# Patient Record
Sex: Male | Born: 2000 | Race: White | Hispanic: No | Marital: Single | Smoking: Current some day smoker
Health system: Southern US, Community
[De-identification: ages and names within clinical notes are randomized; demographics above are authoritative.]

## PROBLEM LIST (undated history)

## (undated) DIAGNOSIS — N433 Hydrocele, unspecified: Secondary | ICD-10-CM

## (undated) HISTORY — DX: Hydrocele, unspecified: N43.3

---
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
## 2019-06-18 ENCOUNTER — Encounter (HOSPITAL_COMMUNITY): Payer: Self-pay | Admitting: Emergency Medicine

## 2019-06-18 ENCOUNTER — Emergency Department (HOSPITAL_COMMUNITY)
Admission: EM | Admit: 2019-06-18 | Discharge: 2019-06-18 | Disposition: A | Discharge status: Home / Self Care | Payer: Medicaid Other | Attending: Emergency Medicine | Admitting: Emergency Medicine

## 2019-06-18 ENCOUNTER — Other Ambulatory Visit: Payer: Self-pay

## 2019-06-18 DIAGNOSIS — F410 Panic disorder [episodic paroxysmal anxiety] without agoraphobia: Secondary | ICD-10-CM | POA: Diagnosis not present

## 2019-06-18 DIAGNOSIS — Z5321 Procedure and treatment not carried out due to patient leaving prior to being seen by health care provider: Secondary | ICD-10-CM | POA: Diagnosis not present

## 2019-06-18 NOTE — ED Triage Notes (Signed)
Pt. Stated, Ray Jimenez been having panic attacks for the last 2 months and the last 2 weeks they have been worse. My hands will lock up.

## 2019-06-18 NOTE — ED Notes (Signed)
Patient called for vitals recheck x2 with no answer

## 2019-06-18 NOTE — ED Notes (Signed)
No answer for vitals recheck x1 

## 2019-06-18 NOTE — ED Notes (Signed)
No answer for vitals recheck x3. Don't see pt in waiting room

## 2019-07-22 ENCOUNTER — Ambulatory Visit: Payer: Medicaid Other | Admitting: Neurology

## 2019-08-03 ENCOUNTER — Ambulatory Visit: Payer: Medicaid Other | Admitting: Diagnostic Neuroimaging

## 2019-08-31 ENCOUNTER — Ambulatory Visit: Payer: Medicaid Other | Admitting: Neurology

## 2020-02-07 ENCOUNTER — Emergency Department
Admission: EM | Admit: 2020-02-07 | Discharge: 2020-02-07 | Disposition: A | Discharge status: Home / Self Care | Payer: Medicaid Other | Attending: Emergency Medicine | Admitting: Emergency Medicine

## 2020-02-07 ENCOUNTER — Emergency Department: Payer: Medicaid Other

## 2020-02-07 ENCOUNTER — Encounter: Payer: Self-pay | Admitting: Emergency Medicine

## 2020-02-07 ENCOUNTER — Other Ambulatory Visit: Payer: Self-pay

## 2020-02-07 DIAGNOSIS — Y929 Unspecified place or not applicable: Secondary | ICD-10-CM | POA: Insufficient documentation

## 2020-02-07 DIAGNOSIS — Y9367 Activity, basketball: Secondary | ICD-10-CM | POA: Insufficient documentation

## 2020-02-07 DIAGNOSIS — S83095A Other dislocation of left patella, initial encounter: Secondary | ICD-10-CM | POA: Insufficient documentation

## 2020-02-07 DIAGNOSIS — S8992XA Unspecified injury of left lower leg, initial encounter: Secondary | ICD-10-CM | POA: Diagnosis present

## 2020-02-07 DIAGNOSIS — F17210 Nicotine dependence, cigarettes, uncomplicated: Secondary | ICD-10-CM | POA: Insufficient documentation

## 2020-02-07 DIAGNOSIS — X58XXXA Exposure to other specified factors, initial encounter: Secondary | ICD-10-CM | POA: Diagnosis not present

## 2020-02-07 DIAGNOSIS — Y999 Unspecified external cause status: Secondary | ICD-10-CM | POA: Diagnosis not present

## 2020-02-07 DIAGNOSIS — S83005A Unspecified dislocation of left patella, initial encounter: Secondary | ICD-10-CM

## 2020-02-07 MED ORDER — FENTANYL CITRATE (PF) 100 MCG/2ML IJ SOLN
INTRAMUSCULAR | Status: AC
  Administered 2020-02-07: 50 ug via INTRAVENOUS
  Filled 2020-02-07: qty 2

## 2020-02-07 MED ORDER — KETOROLAC TROMETHAMINE 30 MG/ML IJ SOLN
INTRAMUSCULAR | Status: AC
  Administered 2020-02-07: 30 mg via INTRAVENOUS
  Filled 2020-02-07: qty 1

## 2020-02-07 MED ORDER — KETOROLAC TROMETHAMINE 30 MG/ML IJ SOLN: 30.0000 mg | Freq: Once | INTRAMUSCULAR | Status: AC

## 2020-02-07 MED ORDER — FENTANYL CITRATE (PF) 100 MCG/2ML IJ SOLN
50.0000 ug | Freq: Once | INTRAMUSCULAR | Status: AC
  Filled 2020-02-07: qty 2

## 2020-02-07 MED ORDER — FENTANYL CITRATE (PF) 100 MCG/2ML IJ SOLN
50.0000 ug | Freq: Once | INTRAMUSCULAR | Status: AC
  Administered 2020-02-07: 50 ug via INTRAVENOUS

## 2020-02-07 NOTE — ED Triage Notes (Signed)
Pt presents to ED via [facility] from basketball court c/o L knee injury. Obvious deformity noted to L knee. Pt given Fentanyl by EMS PTA.

## 2020-02-07 NOTE — ED Provider Notes (Signed)
[facility] Emergency Department Provider Note   ____________________________________________   First MD Initiated Contact with Patient 02/07/20 1649     (approximate)  I have reviewed the triage vital signs and the nursing notes.   HISTORY  Chief Complaint Knee Injury    HPI Ray Jimenez is a 19 y.o. male   here for evaluation of a left knee injury  Patient was running, playing basketball when all of a sudden he felt his left knee locked up on him.  He fell to the ground catching himself.  He didn't get injured falling or strike his head, but he reports his knee was locked up  Called EMS because of severe pain and deformity of his left knee  No numbness or weakness in the left foot or toes.  Denies any injury, reports he was running and it suddenly popped in the left knee  Pain 10 out of 10 sharp severe left knee worsened by any movement  Past Medical History:  Diagnosis Date  . Hydrocele     There are no problems to display for this patient.   History reviewed. No pertinent surgical history.  Prior to Admission medications   Medication Sig Start Date End Date Taking? Authorizing Provider  ibuprofen (ADVIL,MOTRIN) 600 MG tablet Take 1 tablet (600 mg total) by mouth every 6 (six) hours as needed. 01/23/18   Sherrilee Gilles, NP    Allergies Patient has no known allergies.  Family History  Problem Relation Age of Onset  . Allergies Mother   . Ovarian cancer Maternal Grandmother   . Diabetes Paternal Grandmother   . Hypertension Paternal Grandmother     Social History Social History   Tobacco Use  . Smoking status: Current Every Day Smoker  . Smokeless tobacco: Never Used  Substance Use Topics  . Alcohol use: Not Currently  . Drug use: Yes    Types: Marijuana    Review of Systems Constitutional: No fever/chills or recent illness Eyes: No visual changes. ENT: No sore throat. Cardiovascular: Denies chest  pain. Respiratory: Denies shortness of breath. Gastrointestinal: No abdominal pain.   Genitourinary: Negative for dysuria. Musculoskeletal: Negative for back pain.  See HPI Skin: Negative for rash. Neurological: Negative for headaches or areas of focal weakness or numbness    ____________________________________________   PHYSICAL EXAM:  VITAL SIGNS: ED Triage Vitals  Enc Vitals Group     BP 02/07/20 1653 (!) 159/99     Pulse Rate 02/07/20 1653 64     Resp 02/07/20 1653 18     Temp 02/07/20 1653 98.6 F (37 C)     Temp src --      SpO2 02/07/20 1653 99 %     Weight 02/07/20 1647 135 lb (61.2 kg)     Height 02/07/20 1647 5\' 5"  (1.651 m)     Head Circumference --      Peak Flow --      Pain Score 02/07/20 1654 9     Pain Loc --      Pain Edu? --      Excl. in GC? --     Constitutional: Alert and oriented. Well appearing  Eyes: Conjunctivae are normal. Head: Atraumatic. Nose: No congestion/rhinnorhea. Mouth/Throat: Mucous membranes are moist. Neck: No stridor.  Cardiovascular: Normal rate, regular rhythm. Grossly normal heart sounds.  Good peripheral circulation. Respiratory: Normal respiratory effort.  No retractions. Lungs CTAB. Gastrointestinal: Soft and nontender. No distention. Musculoskeletal:   Lower Extremities  No  edema. Normal DP/PT pulses bilateral with good cap refill.  Normal neuro-motor function lower extremities bilateral.  RIGHT Right lower extremity demonstrates normal strength, good use of all muscles. No edema bruising or contusions of the right hip, right knee, right ankle. Full range of motion of the right lower extremity without pain. No pain on axial loading. No evidence of trauma.  LEFT Left lower extremity demonstrates normal strength, good use of all muscles except as limited left knee. No edema bruising or contusions of the hip,  ankle. Full range of motion of the left lower extremity except across the knee where he has significant pain  as well as deformity including patellar dislocation lateral.     Neurologic:  Normal speech and language. No gross focal neurologic deficits are appreciated.  Skin:  Skin is warm, dry and intact. No rash noted. Psychiatric: Mood and affect are normal. Speech and behavior are normal.  ____________________________________________   LABS (all labs ordered are listed, but only abnormal results are displayed)  Labs Reviewed - No data to display ____________________________________________  EKG   ____________________________________________  RADIOLOGY  DG Knee AP/LAT W/Sunrise Left  Result Date: 02/07/2020 CLINICAL DATA:  Knee pain.  Patellar dislocation, postreduction EXAM: LEFT KNEE 3 VIEWS COMPARISON:  None. FINDINGS: Small to moderate joint effusion. No acute bony abnormality. Specifically, no fracture, subluxation, or dislocation. Soft tissues intact. IMPRESSION: Small to moderate joint effusion.  No acute bony abnormality. Electronically Signed   By: Rolm Baptise M.D.   On: 02/07/2020 17:51    Postreduction left knee x-ray with joint effusion but no acute bony abnormality. ____________________________________________   PROCEDURES  Procedure(s) performed: Patella reduction  Reduction of dislocation  Date/Time: 02/07/2020 6:37 PM Performed by: Delman Kitten, MD Authorized by: Delman Kitten, MD  Consent: Verbal consent obtained. Written consent not obtained. Risks and benefits: risks, benefits and alternatives were discussed Consent given by: patient Patient understanding: patient states understanding of the procedure being performed Imaging studies: imaging studies available Required items: required blood products, implants, devices, and special equipment available Patient identity confirmed: verbally with patient and arm band Time out: Immediately prior to procedure a "time out" was called to verify the correct patient, procedure, equipment, support staff and site/side marked  as required. Local anesthesia used: no  Anesthesia: Local anesthesia used: no  Sedation: Patient sedated: no  Comments: Patient left patella reduction performed utilizing standard technique.  Patient tolerated procedure well without any difficulties.     Critical Care performed: No  ____________________________________________   INITIAL IMPRESSION / ASSESSMENT AND PLAN / ED COURSE  Pertinent labs & imaging results that were available during my care of the patient were reviewed by me and considered in my medical decision making (see chart for details).   Patient here for evaluation of left knee pain, my clinical examination appears to have an obvious left lateral patellar dislocation.  This was reduced without complication in the ER.  Patient tolerated the procedure very well.  Postreduction films show good alignment with small effusion.    ----------------------------------------- 6:56 PM on 02/07/2020 -----------------------------------------  Patient resting comfortably reports he feels much better.  Reviewed care recommendations recommendation to follow-up with orthopedics in 1 week.  Patient and his mother both present, comfortable with the plan.  No ongoing issues involving the left leg or left foot on the just feels sore over the knee but feels much better overall  Normal distal capillary refill and neurologic exam left lower foot   ____________________________________________   FINAL CLINICAL  IMPRESSION(S) / ED DIAGNOSES  Final diagnoses:  Closed dislocation of left patella, initial encounter        Note:  This document was prepared using Dragon voice recognition software and may include unintentional dictation errors       Sharyn Creamer, MD 02/07/20 1856

## 2020-02-15 ENCOUNTER — Other Ambulatory Visit: Payer: Self-pay | Admitting: Student

## 2020-02-15 DIAGNOSIS — S83005A Unspecified dislocation of left patella, initial encounter: Secondary | ICD-10-CM

## 2020-02-19 ENCOUNTER — Ambulatory Visit
Admission: RE | Admit: 2020-02-19 | Discharge: 2020-02-19 | Disposition: A | Discharge status: Home / Self Care | Payer: Medicaid Other | Source: Ambulatory Visit | Attending: Student | Admitting: Student

## 2020-02-19 DIAGNOSIS — S83005A Unspecified dislocation of left patella, initial encounter: Secondary | ICD-10-CM | POA: Insufficient documentation

## 2020-10-10 ENCOUNTER — Encounter (INDEPENDENT_AMBULATORY_CARE_PROVIDER_SITE_OTHER): Payer: Self-pay | Admitting: Student in an Organized Health Care Education/Training Program

## 2021-08-16 ENCOUNTER — Other Ambulatory Visit: Payer: Self-pay | Admitting: Student

## 2021-08-16 DIAGNOSIS — M25862 Other specified joint disorders, left knee: Secondary | ICD-10-CM

## 2021-08-16 DIAGNOSIS — S83005D Unspecified dislocation of left patella, subsequent encounter: Secondary | ICD-10-CM

## 2021-08-24 ENCOUNTER — Ambulatory Visit
Admission: RE | Admit: 2021-08-24 | Discharge: 2021-08-24 | Disposition: A | Discharge status: Home / Self Care | Payer: Medicaid Other | Source: Ambulatory Visit | Attending: Student | Admitting: Student

## 2021-08-24 ENCOUNTER — Other Ambulatory Visit: Payer: Self-pay

## 2021-08-24 DIAGNOSIS — M25862 Other specified joint disorders, left knee: Secondary | ICD-10-CM | POA: Diagnosis present

## 2021-08-24 DIAGNOSIS — S83005D Unspecified dislocation of left patella, subsequent encounter: Secondary | ICD-10-CM | POA: Insufficient documentation

## 2023-05-15 IMAGING — MR MR KNEE*L* W/O CM
7 series · 40 of 40 positions shown · non-contrast
Comparison: X-ray 02/07/2020, MRI 02/19/2020

CLINICAL DATA: Anterior left knee pain after fall playing
basketball 2 years ago with prior transient lateral patellar
dislocation

EXAM:
MRI OF THE LEFT KNEE WITHOUT CONTRAST
TECHNIQUE: Multiplanar, multisequence MR imaging of the knee was performed. No
intravenous contrast was administered.

[Series 8: T2 fat-sat · axial · left · 4.0mm · 0.50mm/px · z∈[-100,+24]mm · 7 of 26 slices shown (1 of 3)]
[im 1/26]
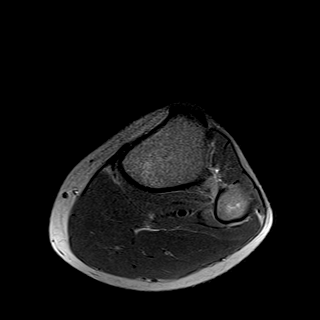
[im 5/26]
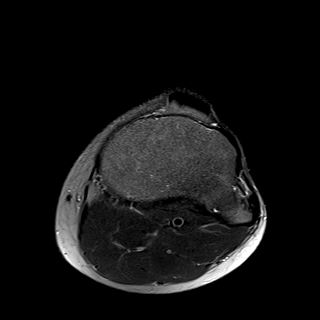
[im 9/26]
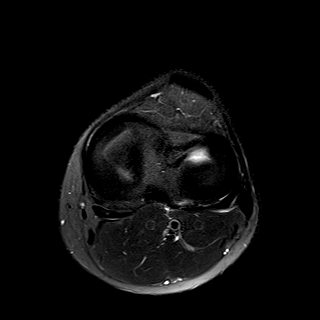
[im 13/26]
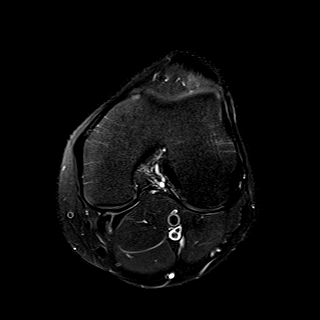
[im 17/26]
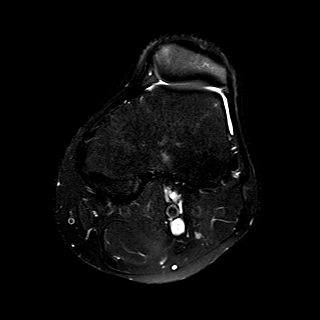
[im 21/26]
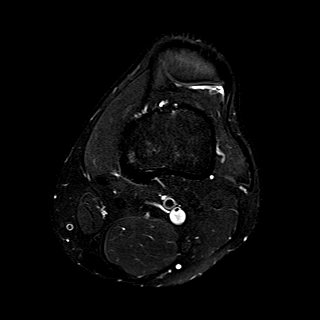
[im 26/26]
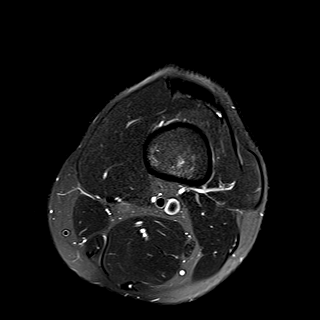

[Series 9: T2 fat-sat · coronal · left · 4.0mm · 0.59mm/px · 6 of 28 slices shown (2 of 3)]
[im 1/28]
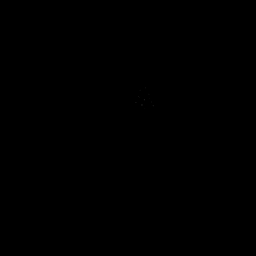
[im 6/28]
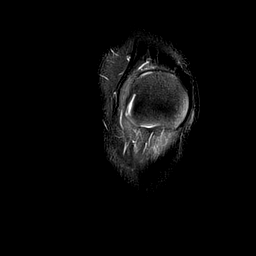
[im 11/28]
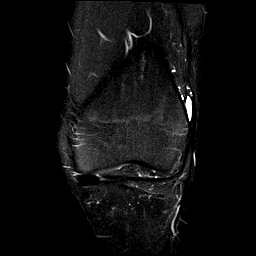
[im 17/28]
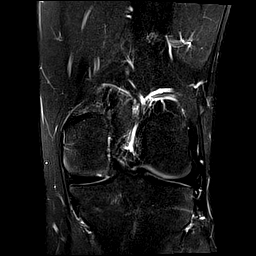
[im 22/28]
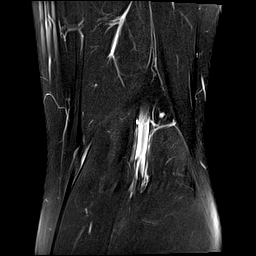
[im 28/28]
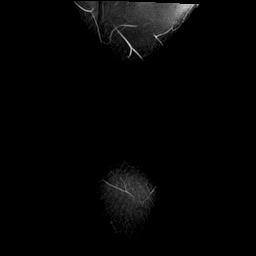

[Series 10: T1 · coronal · left · 4.0mm · 0.59mm/px · 6 of 28 slices shown]
[im 1/28]
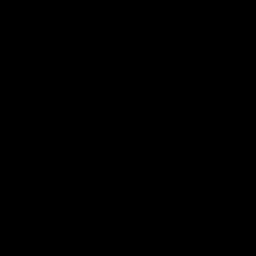
[im 6/28]
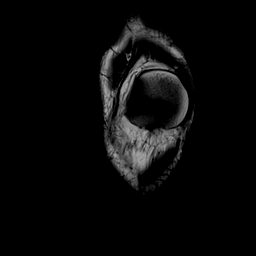
[im 11/28]
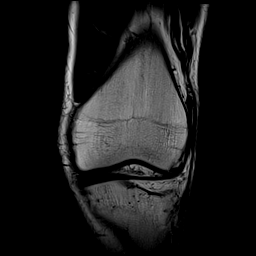
[im 17/28]
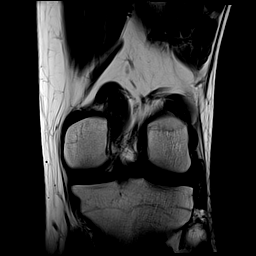
[im 22/28]
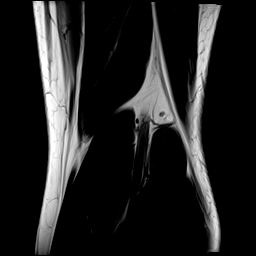
[im 28/28]
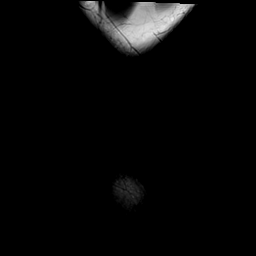

[Series 11: PD fat-sat · coronal · left · 4.0mm · 0.59mm/px · 6 of 28 slices shown (1 of 2)]
[im 1/28]
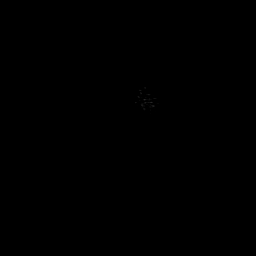
[im 6/28]
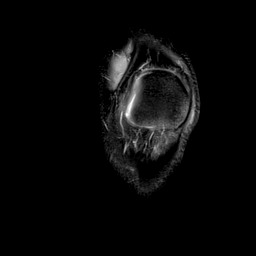
[im 11/28]
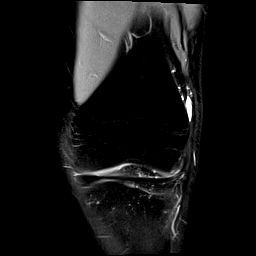
[im 17/28]
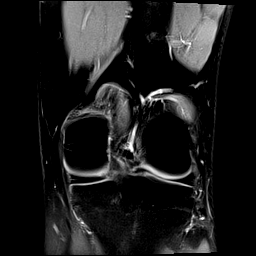
[im 22/28]
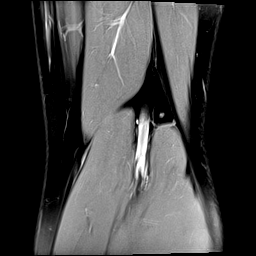
[im 28/28]
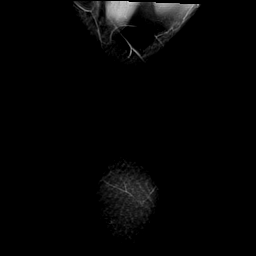

[Series 12: PD fat-sat · sagittal · left · 3.0mm · 0.59mm/px · 6 of 30 slices shown (2 of 2)]
[im 1/30]
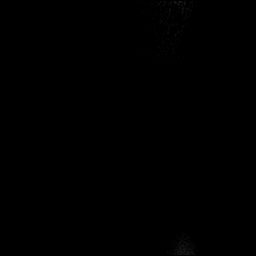
[im 6/30]
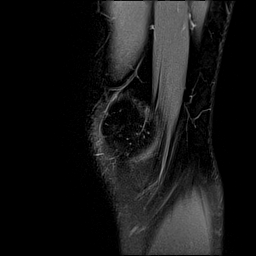
[im 12/30]
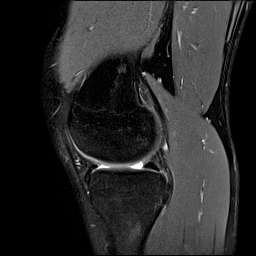
[im 18/30]
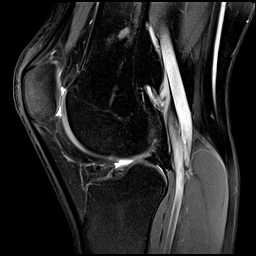
[im 24/30]
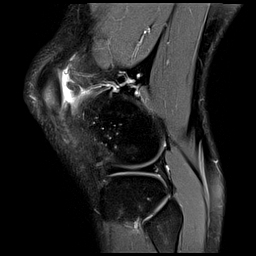
[im 30/30]
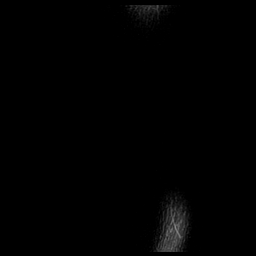

[Series 13: T2 fat-sat · sagittal · left · 3.0mm · 0.59mm/px · 6 of 30 slices shown (3 of 3)]
[im 1/30]
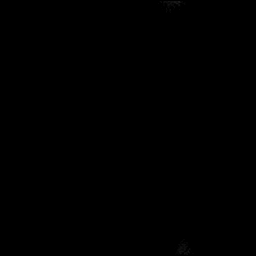
[im 6/30]
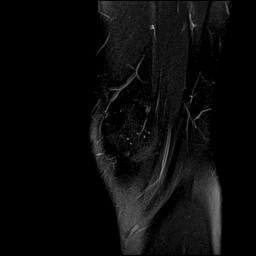
[im 12/30]
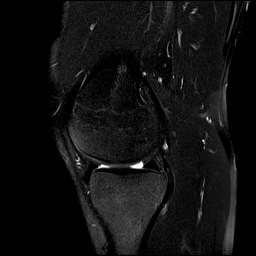
[im 18/30]
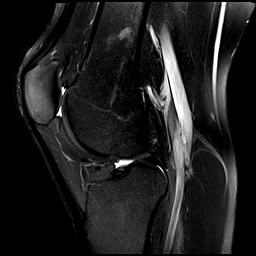
[im 24/30]
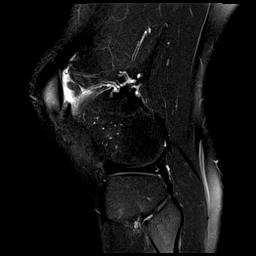
[im 30/30]
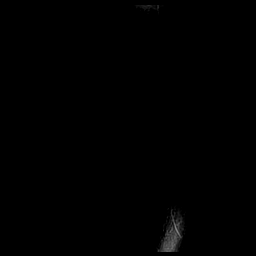

[Series 14: PD · coronal · left · 2.0mm · 0.47mm/px · 3 of 16 slices shown]
[im 1/16]
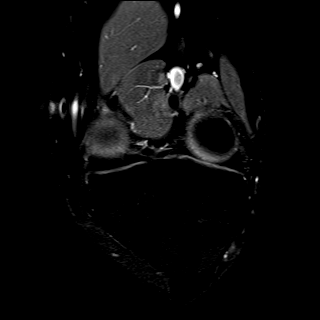
[im 8/16]
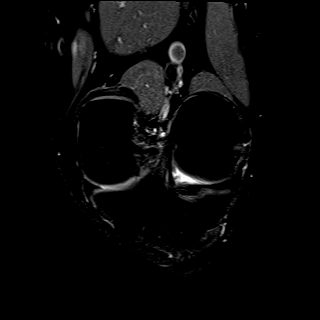
[im 16/16]
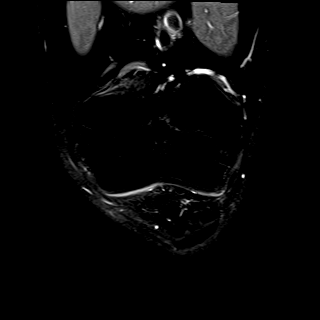

[40 of 40 positions shown; findings below may reference images not displayed]

FINDINGS: MENISCI

Medial meniscus:  Intact.

Lateral meniscus:  Intact.

LIGAMENTS

Cruciates:  Intact ACL and PCL.

Collaterals: Medial collateral ligament is intact. Lateral
collateral ligament complex is intact.

CARTILAGE

Patellofemoral:  Normal.

Medial:  Normal.

Lateral:  Normal.

Joint: Physiologic amount of joint fluid without significant
effusion. Normal fat pads.

Popliteal Fossa:  No Baker cyst. Intact popliteus tendon.

Extensor Mechanism: Intact quadriceps tendon and patellar tendon.
MPFL and patellar retinacula are intact.

Bones: No focal marrow signal abnormality. No fracture or
dislocation. TT-TG distance of 5 mm. Patella alta alignment.

Other: No soft tissue edema or fluid collection. Normal muscle bulk
and signal intensity.
IMPRESSION: Patella alta alignment. Otherwise unremarkable MRI of the left knee.

## 2024-04-06 ENCOUNTER — Emergency Department (HOSPITAL_COMMUNITY)

## 2024-04-06 ENCOUNTER — Emergency Department (HOSPITAL_COMMUNITY)
Admission: EM | Admit: 2024-04-06 | Discharge: 2024-04-06 | Disposition: A | Discharge status: Home / Self Care | Attending: Emergency Medicine | Admitting: Emergency Medicine

## 2024-04-06 ENCOUNTER — Encounter (HOSPITAL_COMMUNITY): Payer: Self-pay

## 2024-04-06 ENCOUNTER — Other Ambulatory Visit: Payer: Self-pay

## 2024-04-06 DIAGNOSIS — Y9367 Activity, basketball: Secondary | ICD-10-CM | POA: Insufficient documentation

## 2024-04-06 DIAGNOSIS — X509XXA Other and unspecified overexertion or strenuous movements or postures, initial encounter: Secondary | ICD-10-CM | POA: Insufficient documentation

## 2024-04-06 DIAGNOSIS — S83005A Unspecified dislocation of left patella, initial encounter: Secondary | ICD-10-CM | POA: Diagnosis not present

## 2024-04-06 DIAGNOSIS — M25562 Pain in left knee: Secondary | ICD-10-CM | POA: Diagnosis present

## 2024-04-06 MED ORDER — DIAZEPAM 5 MG/ML IJ SOLN
2.5000 mg | Freq: Once | INTRAMUSCULAR | Status: AC
  Administered 2024-04-06: 2.5 mg via INTRAVENOUS
  Filled 2024-04-06: qty 2

## 2024-04-06 NOTE — ED Provider Notes (Signed)
 Ray Jimenez EMERGENCY DEPARTMENT AT [facility] Provider Note   CSN: [id number] Arrival date & time: 04/06/24  1046     History  Chief Complaint  Patient presents with   Knee Injury    Ray Jimenez is a 23 y.o. male.  Patient here with left knee pain.  Prior patellar dislocation in the past.  Similar injury today while playing basketball.  Got 100 mcg of fentanyl  with EMS.  He states that he did this to this knee about 4 years ago.  Patient denies any weakness numbness tingling.  Denies any pain elsewhere.  He made a hard cut while playing basketball pain to his left knee noticed deformity.  The history is provided by the patient.       Home Medications Prior to Admission medications   Medication Sig Start Date End Date Taking? Authorizing Provider  ibuprofen  (ADVIL ,MOTRIN ) 600 MG tablet Take 1 tablet (600 mg total) by mouth every 6 (six) hours as needed. 01/23/18   Jannine Meo, NP      Allergies    Patient has no known allergies.    Review of Systems   Review of Systems  Physical Exam Updated Vital Signs BP (!) 136/92   Pulse 87   Temp 98.3 F (36.8 C) (Oral)   Resp 17   Ht 5\' 5"  (1.651 m)   Wt 72.6 kg   SpO2 97%   BMI 26.63 kg/m  Physical Exam Vitals and nursing note reviewed.  Constitutional:      General: He is not in acute distress.    Appearance: He is well-developed.  HENT:     Head: Normocephalic and atraumatic.     Nose: Nose normal.     Mouth/Throat:     Mouth: Mucous membranes are moist.  Eyes:     Extraocular Movements: Extraocular movements intact.     Conjunctiva/sclera: Conjunctivae normal.     Pupils: Pupils are equal, round, and reactive to light.  Cardiovascular:     Rate and Rhythm: Normal rate and regular rhythm.     Pulses: Normal pulses.     Heart sounds: Normal heart sounds. No murmur heard. Pulmonary:     Effort: Pulmonary effort is normal. No respiratory distress.     Breath sounds: Normal breath sounds.   Abdominal:     Palpations: Abdomen is soft.     Tenderness: There is no abdominal tenderness.  Musculoskeletal:        General: Tenderness present. No swelling.     Cervical back: Neck supple.     Comments: Tenderness to the left knee with lateral translation of the patella to the knee  Skin:    General: Skin is warm and dry.     Capillary Refill: Capillary refill takes less than 2 seconds.  Neurological:     Mental Status: He is alert.  Psychiatric:        Mood and Affect: Mood normal.     ED Results / Procedures / Treatments   Labs (all labs ordered are listed, but only abnormal results are displayed) Labs Reviewed - No data to display  EKG None  Radiology DG Knee Complete 4 Views Left Result Date: 04/06/2024 CLINICAL DATA:  Status post reduction of patellar dislocation. EXAM: LEFT KNEE - COMPLETE 4+ VIEW COMPARISON:  Left knee radiographs dated 02/07/2020. MRI of the left knee dated 08/24/2021. FINDINGS: No evidence of acute fracture or dislocation. Moderate-to-large knee joint effusion. No evidence of arthropathy or other focal bone  abnormality. IMPRESSION: 1. No radiographically evident acute fracture or dislocation. 2. Moderate-to-large knee joint effusion. Electronically Signed   By: Mannie Seek M.D.   On: 04/06/2024 13:45    Procedures .Reduction of dislocation  Date/Time: 04/06/2024 11:21 AM  Performed by: Lowery Rue, DO Authorized by: Lowery Rue, DO  Consent: Verbal consent obtained. Written consent obtained. Risks and benefits: risks, benefits and alternatives were discussed Consent given by: patient Patient understanding: patient states understanding of the procedure being performed Patient consent: the patient's understanding of the procedure matches consent given Required items: required blood products, implants, devices, and special equipment available Patient identity confirmed: verbally with patient Time out: Immediately prior to procedure a  "time out" was called to verify the correct patient, procedure, equipment, support staff and site/side marked as required. Preparation: Patient was prepped and draped in the usual sterile fashion. Local anesthesia used: no  Anesthesia: Local anesthesia used: no  Sedation: Patient sedated: no  Patient tolerance: patient tolerated the procedure well with no immediate complications Comments: Patient had successful reduction of left patellar dislocation.  Patient was given 2.5 mg of Valium IV for comfort prior to this.  Tolerated well.  Postreduction film shows alignment of patellar with no fracture or malalignment otherwise.       Medications Ordered in ED Medications  diazepam (VALIUM) injection 2.5 mg (2.5 mg Intravenous Given 04/06/24 1058)    ED Course/ Medical Decision Making/ A&P                                 Medical Decision Making Amount and/or Complexity of Data Reviewed Radiology: ordered.  Risk Prescription drug management.   Ray Jimenez is here with left knee injury.  Unremarkable vitals.  No fever.  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  On exam it looks like he is dislocated his left patellar.  He is neurovascular neuromuscular intact on exam otherwise.  Patient is given 2.5 mg IV Valium for anxiolysis and I perform reduction of his left patellar fracture without any issues.  Got an x-ray of the left knee afterwards and showed no dislocation malalignment or fracture.  Did show moderate to large knee joint effusion.  Neurovascularly intact before and after reduction.  Will place in a knee immobilizer.  Will have him follow-up with Dr. Christiane Cowing with orthopedics.  Minimal weightbearing with crutches and knee immobilizer until he can follow-up with orthopedics.  Recommend Tylenol ibuprofen  ice and rest.  Understands return precautions.  Discharge.  This chart was dictated using voice recognition software.  Despite best efforts to proofread,  errors can  occur which can change the documentation meaning.         Final Clinical Impression(s) / ED Diagnoses Final diagnoses:  Patellar dislocation, left, initial encounter    Rx / DC Orders ED Discharge Orders     None         Lowery Rue, DO 04/06/24 1426

## 2024-04-06 NOTE — Progress Notes (Signed)
 Orthopedic Tech Progress Note Patient Details:  IVA POSTEN [birth year] [id number]  Ortho Devices Type of Ortho Device: Knee Immobilizer Ortho Device/Splint Location: LLE Ortho Device/Splint Interventions: Ordered, Application, Adjustment   Post Interventions Patient Tolerated: Well Instructions Provided: Care of device  Kermitt Pedlar 04/06/2024, 1:01 PM

## 2024-04-06 NOTE — ED Notes (Signed)
 Patient transported to X-ray

## 2024-04-06 NOTE — Discharge Instructions (Signed)
 Wear knee immobilizer at all time.  It is okay to remove for shower.  But try to avoid putting any major weight or major flexion extension of the left knee until you see orthopedics.  Use crutches and recommend very minimal weightbearing to left lower extremity if needed at all.  Recommend Tylenol ibuprofen  for pain.  1000 mg of Tylenol every 6 hours as needed for pain.  800 mg ibuprofen  every 8 hours as needed for pain.  Recommend ice.  Follow-up with orthopedics Dr. Christiane Cowing

## 2024-04-06 NOTE — ED Triage Notes (Signed)
 Pt to er trauma A, per ems pt was playing basketball and landed on his L knee, pt had deformity to knee, pt has what appears to be a dislocated patella, pt states that this has happened to him in the past with the same knee. EMS placed a 22 gauge in L hand and gave 100mcg of fentanyl .

## 2024-04-13 ENCOUNTER — Encounter: Payer: Self-pay | Admitting: Physician Assistant

## 2024-04-13 ENCOUNTER — Ambulatory Visit: Admitting: Physician Assistant

## 2024-04-13 DIAGNOSIS — S83005A Unspecified dislocation of left patella, initial encounter: Secondary | ICD-10-CM

## 2024-04-13 NOTE — Addendum Note (Signed)
 Addended by: Tatiyanna Lashley on: 04/13/2024 11:08 AM   Modules accepted: Orders

## 2024-04-13 NOTE — Progress Notes (Signed)
 Office Visit Note   Patient: Ray Jimenez           Date of Birth: May 09, 2001           MRN: [id number] Visit Date: 04/13/2024              Requested by: Josefine Nice, MD [address] [place],  [place] [address] PCP: Josefine Nice, MD   Assessment & Plan: Visit Diagnoses:  1. Patellar dislocation, left, initial encounter     Plan: Impression is left knee patellar dislocation.  This is happened twice now and patient currently has a moderate effusion and tenderness to not only the medial retinaculum but the medial joint line.  Range of motion is limited likely from the effusion.  I would like to go ahead and order an MRI of the left knee to assess for structural abnormalities.  He will remain weightbearing as tolerated in the knee immobilizer.  Ice and elevate for pain and swelling.  Follow-up with Dr. Christiane Cowing to go over the MRI once completed.  Call with concerns or questions in the meantime.  Follow-Up Instructions: Return for f/u with Xu after MRI.   Orders:  No orders of the defined types were placed in this encounter.  No orders of the defined types were placed in this encounter.     Procedures: No procedures performed   Clinical Data: No additional findings.   Subjective: Chief Complaint  Patient presents with   Left Knee - Pain    HPI patient is a pleasant 23-year-old gentleman who comes in today following an injury to his left knee.  About a week ago, he was playing basketball when he felt his left knee dislocate.  He is unsure exactly how this happened but notes this previously happened about 4 years ago while playing basketball and stepping wrong.  He was seen in the ED where his patellar dislocation was reduced.  He was placed in knee immobilizer and is here today for follow-up.  He is having mild pain to the medial knee.  Symptoms are worse with walking.  He has not been taking anything for the pain.    Review of Systems as detailed in HPI.  All others  reviewed and are negative.   Objective: Vital Signs: There were no vitals taken for this visit.  Physical Exam well-developed well-nourished gentleman in no acute distress.  Alert and oriented x 3.  Ortho Exam left knee exam: Moderate effusion.  Range of motion 0 to 90 degrees.  He does have tenderness along the anteromedial joint line.  He also has tenderness along the medial retinaculum.  Mild patellar apprehension.  He is stable to valgus and varus stress.  He is neurovascularly intact distally.  Specialty Comments:  No specialty comments available.  Imaging: No new imaging   PMFS History: There are no active problems to display for this patient.  Past Medical History:  Diagnosis Date   Hydrocele     Family History  Problem Relation Age of Onset   Allergies Mother    Ovarian cancer Maternal Grandmother    Diabetes Paternal Grandmother    Hypertension Paternal Grandmother     History reviewed. No pertinent surgical history. Social History   Occupational History   Not on file  Tobacco Use   Smoking status: Some Days    Types: Cigars   Smokeless tobacco: Never  Vaping Use   Vaping status: Never Used  Substance and Sexual Activity   Alcohol  use: Yes   Drug use: Not Currently    Types: Marijuana   Sexual activity: Not on file

## 2024-04-15 ENCOUNTER — Encounter: Payer: Self-pay | Admitting: Orthopaedic Surgery

## 2024-04-15 ENCOUNTER — Telehealth: Payer: Self-pay | Admitting: Physician Assistant

## 2024-04-15 NOTE — Telephone Encounter (Signed)
 Note to be out of work until follow up has been added to patient's chart.

## 2024-04-15 NOTE — Telephone Encounter (Signed)
 Patient called and said that work at [employer] [phone]

## 2024-04-15 NOTE — Telephone Encounter (Signed)
 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  If they do not have that he can be out of work until [employer]

## 2024-04-15 NOTE — Telephone Encounter (Signed)
 What does he do for work?

## 2024-04-15 NOTE — Telephone Encounter (Signed)
 Please advise pts work status and provide note. FMLA forms have been received. Thank you!

## 2024-04-17 ENCOUNTER — Ambulatory Visit
Admission: RE | Admit: 2024-04-17 | Discharge: 2024-04-17 | Disposition: A | Discharge status: Home / Self Care | Source: Ambulatory Visit | Attending: Orthopaedic Surgery | Admitting: Orthopaedic Surgery

## 2024-04-17 DIAGNOSIS — S83005A Unspecified dislocation of left patella, initial encounter: Secondary | ICD-10-CM

## 2024-04-23 ENCOUNTER — Ambulatory Visit: Admitting: Orthopaedic Surgery

## 2024-04-23 DIAGNOSIS — S83005A Unspecified dislocation of left patella, initial encounter: Secondary | ICD-10-CM

## 2024-04-23 NOTE — Progress Notes (Signed)
 Office Visit Note   Patient: Ray Jimenez           Date of Birth: 03/09/2001           MRN: [id number] Visit Date: 04/23/2024              Requested by: Josefine Nice, MD [address] [place],  [place] [address] PCP: Josefine Nice, MD   Assessment & Plan: Visit Diagnoses:  1. Patellar dislocation, left, initial encounter     Plan: History of Present Illness Ray Jimenez is a 23 year old male who presents follow up for dislocation of the right patella.  He experienced a second dislocation of the right kneecap on June 5th or 6th, 2025, with the first occurring in 2021. An MRI shows a bone bruise on both the medial and lateral aspects of the kneecap and a tear in the medial patellofemoral ligament (MPFL). It is unclear if the tear is new or from the previous injury. He has limited knee mobility due to swelling and inflammation.  He did not complete the recommended physical therapy after the initial dislocation. He is concerned about his ability to return to work, which involves significant standing and squatting. He is currently on disability and is interested in returning to work if light duty is available.  Results RADIOLOGY Knee MRI: Bone bruise on the medial and lateral aspects of the patella. Shallow femoral sulcus. Torn medial patellofemoral ligament (MPFL). 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 Conservative management preferred initially. - Refer to physical therapy for quadriceps strengthening and knee stabilization exercises. - Provide a PSO knee brace for kneecap stabilization. - Discuss surgical options if instability persists after physical therapy, including MPFL reconstruction - Advise work restrictions: no lifting over 20 pounds, no squatting past 90 degrees for six weeks.  Bone bruise of the knee Bone bruise on medial and lateral  left knee expected to heal with conservative management.  Work status Currently on disability due to knee injury. Advised to discuss light duty options with employer. - Discuss light duty options with employer. - If unavailable, continue on disability until reevaluation in six weeks.  Follow-Up Instructions: Return in about 6 weeks (around 06/04/2024).   Orders:  Orders Placed This Encounter  Procedures   Ambulatory referral to Physical Therapy   No orders of the defined types were placed in this encounter.   Subjective: Chief Complaint  Patient presents with   Left Knee - Follow-up    MRI review    HPI  Review of Systems  Constitutional: Negative.   HENT: Negative.    Eyes: Negative.   Respiratory: Negative.    Cardiovascular: Negative.   Gastrointestinal: Negative.   Endocrine: Negative.   Genitourinary: Negative.   Skin: Negative.   Allergic/Immunologic: Negative.   Neurological: Negative.   Hematological: Negative.   Psychiatric/Behavioral: Negative.    All other systems reviewed and are negative.    Objective: Vital Signs: There were no vitals taken for this visit.  Physical Exam Vitals and nursing note reviewed.  Constitutional:      Appearance: He is well-developed.  HENT:     Head: Normocephalic and atraumatic.   Eyes:     Pupils: Pupils are equal, round, and reactive to light.   Pulmonary:     Effort: Pulmonary effort is normal.  Abdominal:     Palpations: Abdomen is soft.  Musculoskeletal:        General: Normal range of motion.     Cervical back: Neck supple.   Skin:    General: Skin is warm.   Neurological:     Mental Status: He is alert and oriented to person, place, and time.   Psychiatric:        Behavior: Behavior normal.        Thought Content: Thought content normal.        Judgment: Judgment normal.     Ortho Exam  Specialty Comments:  No specialty comments available.  Imaging: No results found.   PMFS  History: There are no active problems to display for this patient.  Past Medical History:  Diagnosis Date   Hydrocele     Family History  Problem Relation Age of Onset   Allergies Mother    Ovarian cancer Maternal Grandmother    Diabetes Paternal Grandmother    Hypertension Paternal Grandmother     No past surgical history on file. Social History   Occupational History   Not on file  Tobacco Use   Smoking status: Some Days    Types: Cigars   Smokeless tobacco: Never  Vaping Use   Vaping status: Never Used  Substance and Sexual Activity   Alcohol use: Yes   Drug use: Not Currently    Types: Marijuana   Sexual activity: Not on file

## 2024-05-12 ENCOUNTER — Ambulatory Visit (INDEPENDENT_AMBULATORY_CARE_PROVIDER_SITE_OTHER): Admitting: Rehabilitative and Restorative Service Providers"

## 2024-05-12 ENCOUNTER — Encounter: Payer: Self-pay | Admitting: Rehabilitative and Restorative Service Providers"

## 2024-05-12 DIAGNOSIS — M25562 Pain in left knee: Secondary | ICD-10-CM | POA: Diagnosis not present

## 2024-05-12 DIAGNOSIS — R600 Localized edema: Secondary | ICD-10-CM | POA: Diagnosis not present

## 2024-05-12 DIAGNOSIS — R262 Difficulty in walking, not elsewhere classified: Secondary | ICD-10-CM | POA: Diagnosis not present

## 2024-05-12 DIAGNOSIS — M6281 Muscle weakness (generalized): Secondary | ICD-10-CM

## 2024-05-12 DIAGNOSIS — M25662 Stiffness of left knee, not elsewhere classified: Secondary | ICD-10-CM

## 2024-05-12 NOTE — Therapy (Signed)
 OUTPATIENT PHYSICAL THERAPY LOWER EXTREMITY EVALUATION   Patient Name: Ray Jimenez MRN: [id number] DOB:[birth year], 23 y.o., male Today's Date: 05/12/2024  END OF SESSION:  PT End of Session - 05/12/24 1720     Visit Number 1    Number of Visits 16    Date for PT Re-Evaluation 07/07/24    Authorization Type [employer]    Progress Note Due on Visit 16    PT Start Time 1140    PT Stop Time 1231    PT Time Calculation (min) 51 min    Activity Tolerance Patient tolerated treatment well;No increased pain;Patient limited by pain    Behavior During Therapy [facility] for tasks assessed/performed          Past Medical History:  Diagnosis Date   Hydrocele    History reviewed. No pertinent surgical history. There are no active problems to display for this patient.   PCP: Ray LULLA Holt, MD  REFERRING PROVIDER: Kay CHRISTELLA Cummins, MD  REFERRING DIAG: [phone] (ICD-10-CM) - Patellar dislocation, left, initial encounter  THERAPY DIAG:  Difficulty in walking, not elsewhere classified - Plan: PT plan of care cert/re-cert  Muscle weakness (generalized) - Plan: PT plan of care cert/re-cert  Localized edema - Plan: PT plan of care cert/re-cert  Acute pain of left knee - Plan: PT plan of care cert/re-cert  Stiffness of left knee, not elsewhere classified - Plan: PT plan of care cert/re-cert  Rationale for Evaluation and Treatment: Rehabilitation  ONSET DATE: On or around July 4th, 2nd dislocation  SUBJECTIVE:   SUBJECTIVE STATEMENT: Ray Jimenez dislocated his left knee for the 2nd time around July 4 of this year.  Previous dislocation was 4 years ago in April 2021.    PERTINENT HISTORY: NA  PAIN:  Are you having pain? Yes: NPRS scale: 0-3/10 most of the time with an occasional brief, sharp 5-6/10 Pain location: Medial left knee Pain description: Ache, sore to touch, can get a brief sharp pain Aggravating factors: Squatting, knee flexion Relieving factors: Ice  PRECAUTIONS: Knee  RED  FLAGS: None   WEIGHT BEARING RESTRICTIONS: No  FALLS:  Has patient fallen in last 6 months? Yes. Number of falls 1, related to this episode of PT  LIVING ENVIRONMENT: Lives with: lives with their family Lives in: House/apartment Stairs: Slow and achy but can do them Has following equipment at home: None  OCCUPATION: Geographical information systems officer  PLOF: Independent  PATIENT GOALS: Get confidence back and return to work  NEXT MD VISIT: 06/04/2024 AT 9 AM  OBJECTIVE:  Note: Objective measures were completed at Evaluation unless otherwise noted.  DIAGNOSTIC FINDINGS: 1. Sequelae of recent lateral patellar dislocation with severe osseous contusion of the inferomedial patella and periphery of the anterolateral femoral condyle. Complete tear of the femoral insertion of the MPFL. 2. No meniscal injury of the left knee. 3. Large joint effusion.  PATIENT SURVEYS:  PSFS: THE PATIENT SPECIFIC FUNCTIONAL SCALE  Place score of 0-10 (0 = unable to perform activity and 10 = able to perform activity at the same level as before injury or problem)  Activity Date: 05/12/2024    Work  4/10    2.   Running 2/10    3.   Squatting 4/10    4.      Total Score 3.33      Total Score = Sum of activity scores/number of activities  Minimally Detectable Change: 3 points (for single activity); 2 points (for average score)  [name] (nd).  The Patient Specific Functional Scale . Retrieved from [email or web address]   COGNITION: Overall cognitive status: Within functional limits for tasks assessed     SENSATION: WFL  EDEMA:  Noted and not assessed  LOWER EXTREMITY ROM:  Active ROM Left/Right 05/12/2024   Hip flexion    Hip extension    Hip abduction    Hip adduction    Hip internal rotation    Hip external rotation    Knee flexion 122/130   Knee extension -3/-1   Ankle dorsiflexion    Ankle plantarflexion    Ankle inversion     Ankle eversion    Hamstrings 35/40   Quadriceps 110/120   Ober's Test +/+    (Blank rows = not tested)  LOWER EXTREMITY STRENGTH:  In pounds with hand-held dynamometer Left/Right 05/12/2024   Hip flexion    Hip extension    Hip abduction    Hip adduction    Hip internal rotation    Hip external rotation    Knee flexion    Knee extension 67.1/79.4   Ankle dorsiflexion    Ankle plantarflexion    Ankle inversion    Ankle eversion     (Blank rows = not tested)  GAIT: Distance walked: 100 feet Assistive device utilized: None Level of assistance: Complete Independence Comments: 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, theatre/television/film                                                                                                                                TREATMENT DATE: 05/12/2024  Supine ITB stretch 4 x 20 seconds Seated straight leg raises 3 sets of 10 with 3#  [id number]: Reviewed imaging, knee anatomy as it relates to patellar dislocations, examination findings and day 1 home exercise program   PATIENT EDUCATION:  Education details: See above Person educated: Patient Education method: See above Education comprehension: verbalized understanding, returned demonstration, verbal cues required, tactile cues required, and needs further education  HOME EXERCISE PROGRAM: Access Code: [email or web address] URL: [email or web address] Date: 05/12/2024 Prepared by: Lamar Ivory  Exercises - Supine ITB Stretch with Strap  - 2-3 x daily - 7 x weekly - 1 sets - 5 reps - 20 seconds hold - Seated Straight Leg Raise  - 2 x daily - 7 x weekly - 5 sets - 10 reps - 3 seconds hold  ASSESSMENT:  CLINICAL IMPRESSION: Patient is a 23 y.o. male who was seen today for physical therapy evaluation and treatment for S83.005A (ICD-10-CM) - Patellar dislocation, left, initial encounter.  Ray Jimenez had a left lateral patellar dislocation around July 4.  This was his second dislocation and it is noted that he  tore his medial patellofemoral ligament.  Significant clinical findings included very tight IT bands bilaterally, tight hamstrings and quadriceps, particularly on the left and significant quadriceps strength impairments bilaterally.  Ray Jimenez was set up on an appropriate home exercise program to address impairments noted during  today's evaluation.  OBJECTIVE IMPAIRMENTS: Abnormal gait, cardiopulmonary status limiting activity, decreased activity tolerance, decreased endurance, decreased knowledge of condition, difficulty walking, decreased ROM, decreased strength, decreased safety awareness, increased edema, impaired flexibility, and pain.   ACTIVITY LIMITATIONS: carrying, lifting, bending, standing, squatting, stairs, and locomotion level  PARTICIPATION LIMITATIONS: cleaning, community activity, and occupation  PERSONAL FACTORS: No personal factors are affecting this patient's functional outcome.   REHAB POTENTIAL: Good  CLINICAL DECISION MAKING: Stable/uncomplicated  EVALUATION COMPLEXITY: Low   GOALS: Goals reviewed with patient? Yes  SHORT TERM GOALS: Target date: 06/09/2024 Ray Jimenez will be independent with his day 1 HEP Baseline: Started 05/12/2024 Goal status: INITIAL  2.  Improve left knee flexion active range of motion to 130 degrees Baseline: 122 degrees Goal status: INITIAL  3.  Improve left quadriceps strength as assessed by objective measures and functional self-report Baseline: 67.1 pounds Goal status: INITIAL   LONG TERM GOALS: Target date: 07/07/2024  Improve Patient Specific Functional Scale to at least 7 Baseline: 3.33 Goal status: INITIAL  2.  Ray Jimenez will report left knee pain consistently 0/10 on the Visual Analog Scale Baseline:  Goal status: INITIAL  3.  Improve bilateral quadriceps strength to at least 90 pounds Baseline: 67.1/79.4 pounds Goal status: INITIAL  4.  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  Ray Jimenez will be independent with his long-term maintenance HEP at [place] Baseline: Started 05/12/2024 Goal status: INITIAL   PLAN:  PT FREQUENCY: 1-2x/week  PT DURATION: 8 weeks  PLANNED INTERVENTIONS: 97750- Physical Performance Testing, 97110-Therapeutic exercises, 97530- Therapeutic activity, [id number]- Neuromuscular re-education, [id number]- Self Care, [id number]- Manual therapy, [id number]- Gait training, [phone]- Electrical stimulation (unattended), [id number]- Vasopneumatic device, Patient/Family education, Balance training, Stair training, and Cryotherapy  PLAN FOR NEXT SESSION: Heavy emphasis on quadriceps strengthening and appropriate stretching (particularly IT band) to address impairments noted at evaluation.   Myer LELON Ivory, PT, MPT 05/12/2024, 5:43 PM

## 2024-05-20 NOTE — Therapy (Signed)
 OUTPATIENT PHYSICAL THERAPY LOWER EXTREMITY EVALUATION   Patient Name: Ray Jimenez MRN: [id number] DOB:[birth year], 23 y.o., male Today's Date: 05/21/2024  END OF SESSION:  PT End of Session - 05/21/24 0927     Visit Number 2    Number of Visits 16    Date for PT Re-Evaluation 07/07/24    Authorization Type BCBS    Progress Note Due on Visit 16    PT Start Time 0929    PT Stop Time 1017    PT Time Calculation (min) 48 min    Activity Tolerance Patient tolerated treatment well;No increased pain;Patient limited by pain    Behavior During Therapy [facility] for tasks assessed/performed           Past Medical History:  Diagnosis Date   Hydrocele    History reviewed. No pertinent surgical history. There are no active problems to display for this patient.   PCP: Eleanor LULLA Holt, MD  REFERRING PROVIDER: Kay CHRISTELLA Cummins, MD  REFERRING DIAG: [phone] (ICD-10-CM) - Patellar dislocation, left, initial encounter  THERAPY DIAG:  Localized edema  Muscle weakness (generalized)  Difficulty in walking, not elsewhere classified  Acute pain of left knee  Stiffness of left knee, not elsewhere classified  Rationale for Evaluation and Treatment: Rehabilitation  ONSET DATE: On or around July 4th, 2nd dislocation  SUBJECTIVE:   SUBJECTIVE STATEMENT: I was in the gym last night so I'm a little sore. Soreness level rated 3-4/10, pain rated 2/10  PERTINENT HISTORY: NA  PAIN:  Are you having pain? Yes: NPRS scale: 0-3/10 most of the time with an occasional brief, sharp 5-6/10 Pain location: Medial left knee Pain description: Ache, sore to touch, can get a brief sharp pain Aggravating factors: Squatting, knee flexion Relieving factors: Ice  PRECAUTIONS: Knee  RED FLAGS: None   WEIGHT BEARING RESTRICTIONS: No  FALLS:  Has patient fallen in last 6 months? Yes. Number of falls 1, related to this episode of PT  LIVING ENVIRONMENT: Lives with: lives with their family Lives  in: House/apartment Stairs: Slow and achy but can do them Has following equipment at home: None  OCCUPATION: Geographical information systems officer  PLOF: Independent  PATIENT GOALS: Get confidence back and return to work  NEXT MD VISIT: 06/04/2024 AT 9 AM  OBJECTIVE:  Note: Objective measures were completed at Evaluation unless otherwise noted.  DIAGNOSTIC FINDINGS: 1. Sequelae of recent lateral patellar dislocation with severe osseous contusion of the inferomedial patella and periphery of the anterolateral femoral condyle. Complete tear of the femoral insertion of the MPFL. 2. No meniscal injury of the left knee. 3. Large joint effusion.  PATIENT SURVEYS:  PSFS: THE PATIENT SPECIFIC FUNCTIONAL SCALE  Place score of 0-10 (0 = unable to perform activity and 10 = able to perform activity at the same level as before injury or problem)  Activity Date: 05/12/2024    Work  4/10    2.   Running 2/10    3.   Squatting 4/10    4.      Total Score 3.33      Total Score = Sum of activity scores/number of activities  Minimally Detectable Change: 3 points (for single activity); 2 points (for average score)  [name] (nd). The Patient Specific Functional Scale . Retrieved from [email or web address]   COGNITION: Overall cognitive status: Within functional limits for tasks assessed     SENSATION: WFL  EDEMA:  Noted and not assessed  LOWER EXTREMITY ROM:  Active ROM Left/Right  05/12/2024   Hip flexion    Hip extension    Hip abduction    Hip adduction    Hip internal rotation    Hip external rotation    Knee flexion 122/130   Knee extension -3/-1   Ankle dorsiflexion    Ankle plantarflexion    Ankle inversion    Ankle eversion    Hamstrings 35/40   Quadriceps 110/120   Ober's Test +/+    (Blank rows = not tested)  LOWER EXTREMITY STRENGTH:  In pounds with hand-held dynamometer Left/Right 05/12/2024   Hip flexion    Hip  extension    Hip abduction    Hip adduction    Hip internal rotation    Hip external rotation    Knee flexion    Knee extension 67.1/79.4   Ankle dorsiflexion    Ankle plantarflexion    Ankle inversion    Ankle eversion     (Blank rows = not tested)  GAIT: Distance walked: 100 feet Assistive device utilized: None Level of assistance: Complete Independence Comments: 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 for 6 minutes used as warm up TKE with red TB 1x10 with 3 second hold; with blue TB 2x10 with 3 second hold  Step ups in parallel bars with 6 step using L LE only 2x10 with 2-3 second hold at top of movement   attempted no UE use on parallel bars but had increase in inferior knee pain 2/2 decreased balance Leg press squats B up, down with L LE only with focus on slow eccentric motion 2x12 Single leg leg extension with L LE only 2x13 Supine IT band stretch with L LE only 3x30s   05/12/2024  Supine ITB stretch 4 x 20 seconds Seated straight leg raises 3 sets of 10 with 3#  [id number]: Reviewed imaging, knee anatomy as it relates to patellar dislocations, examination findings and day 1 home exercise program   PATIENT EDUCATION:  Education details: See above Person educated: Patient Education method: See above Education comprehension: verbalized understanding, returned demonstration, verbal cues required, tactile cues required, and needs further education  HOME EXERCISE PROGRAM: Access Code: [email or web address] URL: [email or web address] Date: 05/12/2024 Prepared by: Lamar Ivory  Exercises - Supine ITB Stretch with Strap  - 2-3 x daily - 7 x weekly - 1 sets - 5 reps - 20 seconds hold - Seated Straight Leg Raise  - 2 x daily - 7 x weekly - 5 sets - 10 reps - 3 seconds  hold  ASSESSMENT:  CLINICAL IMPRESSION: Patient arrived to session with c/o soreness and minimal pain this date. Patient tolerated treatment well with only one instance of increase knee pain/discomfort with  step ups that dissipated with UE use on parallel bars which diminished the balance load. Patient will continue to benefit from skilled PT to improve above noted deficits.  OBJECTIVE IMPAIRMENTS: Abnormal gait, cardiopulmonary status limiting activity, decreased activity tolerance, decreased endurance, decreased knowledge of condition, difficulty walking, decreased ROM, decreased strength, decreased safety awareness, increased edema, impaired flexibility, and pain.   ACTIVITY LIMITATIONS: carrying, lifting, bending, standing, squatting, stairs, and locomotion level  PARTICIPATION LIMITATIONS: cleaning, community activity, and occupation  PERSONAL FACTORS: No personal factors are affecting this patient's functional outcome.   REHAB POTENTIAL: Good  CLINICAL DECISION MAKING: Stable/uncomplicated  EVALUATION COMPLEXITY: Low   GOALS: Goals reviewed with patient? Yes  SHORT TERM GOALS: Target date: 06/09/2024 Dallan will be independent with his day 1 HEP Baseline: Started 05/12/2024 Goal status: INITIAL  2.  Improve left knee flexion active range of motion to 130 degrees Baseline: 122 degrees Goal status: INITIAL  3.  Improve left quadriceps strength as assessed by objective measures and functional self-report Baseline: 67.1 pounds Goal status: INITIAL   LONG TERM GOALS: Target date: 07/07/2024  Improve Patient Specific Functional Scale to at least 7 Baseline: 3.33 Goal status: INITIAL  2.  Keandre will report left knee pain consistently 0/10 on the Visual Analog Scale Baseline:  Goal status: INITIAL  3.  Improve bilateral quadriceps strength to at least 90 pounds Baseline: 67.1/79.4 pounds Goal status: INITIAL  4.  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  Izsak will be independent with his long-term maintenance HEP at [place] Baseline: Started 05/12/2024 Goal status: INITIAL   PLAN:  PT FREQUENCY: 1-2x/week  PT DURATION: 8 weeks  PLANNED INTERVENTIONS: 97750- Physical Performance Testing, 97110-Therapeutic exercises, 97530- Therapeutic activity, [id number]- Neuromuscular re-education, [id number]- Self Care, 02859- Manual therapy, [id number]- Gait training, [phone]- Electrical stimulation (unattended), 97016- Vasopneumatic device, Patient/Family education, Balance training, Stair training, and Cryotherapy  PLAN FOR NEXT SESSION: quad strengthening, gentle range of motion (</= 90deg), begin static balance challenges   Susannah Daring, PT, DPT 05/21/24 10:20 AM

## 2024-05-21 ENCOUNTER — Ambulatory Visit

## 2024-05-21 DIAGNOSIS — R262 Difficulty in walking, not elsewhere classified: Secondary | ICD-10-CM | POA: Diagnosis not present

## 2024-05-21 DIAGNOSIS — R600 Localized edema: Secondary | ICD-10-CM

## 2024-05-21 DIAGNOSIS — M25562 Pain in left knee: Secondary | ICD-10-CM

## 2024-05-21 DIAGNOSIS — M6281 Muscle weakness (generalized): Secondary | ICD-10-CM | POA: Diagnosis not present

## 2024-05-21 DIAGNOSIS — M25662 Stiffness of left knee, not elsewhere classified: Secondary | ICD-10-CM

## 2024-06-03 ENCOUNTER — Ambulatory Visit: Admitting: Rehabilitative and Restorative Service Providers"

## 2024-06-03 ENCOUNTER — Encounter: Payer: Self-pay | Admitting: Rehabilitative and Restorative Service Providers"

## 2024-06-03 DIAGNOSIS — R600 Localized edema: Secondary | ICD-10-CM

## 2024-06-03 DIAGNOSIS — M25662 Stiffness of left knee, not elsewhere classified: Secondary | ICD-10-CM

## 2024-06-03 DIAGNOSIS — M25562 Pain in left knee: Secondary | ICD-10-CM | POA: Diagnosis not present

## 2024-06-03 DIAGNOSIS — M6281 Muscle weakness (generalized): Secondary | ICD-10-CM | POA: Diagnosis not present

## 2024-06-03 DIAGNOSIS — R262 Difficulty in walking, not elsewhere classified: Secondary | ICD-10-CM | POA: Diagnosis not present

## 2024-06-03 NOTE — Therapy (Signed)
 OUTPATIENT PHYSICAL THERAPY LOWER EXTREMITY TREATMENT/PROGRESS NOTE  Progress Note Reporting Period 05/12/2024 to 06/03/2024  See note below for Objective Data and Assessment of Progress/Goals.     Patient Name: Ray Jimenez MRN: [id number] DOB:February 16, 2001, 23 y.o., male Today's Date: 06/03/2024  END OF SESSION:  PT End of Session - 06/03/24 1104     Visit Number 3    Number of Visits 16    Date for PT Re-Evaluation 07/07/24    Authorization Type BCBS    Progress Note Due on Visit 16    PT Start Time 1103    PT Stop Time 1145    PT Time Calculation (min) 42 min    Activity Tolerance Patient tolerated treatment well;No increased pain;Patient limited by pain    Behavior During Therapy [facility] for tasks assessed/performed            Past Medical History:  Diagnosis Date   Hydrocele    History reviewed. No pertinent surgical history. There are no active problems to display for this patient.   PCP: Ray LULLA Holt, MD  REFERRING PROVIDER: Kay CHRISTELLA Cummins, MD  REFERRING DIAG: [phone] (ICD-10-CM) - Patellar dislocation, left, initial encounter  THERAPY DIAG:  Localized edema  Muscle weakness (generalized)  Difficulty in walking, not elsewhere classified  Acute pain of left knee  Stiffness of left knee, not elsewhere classified  Rationale for Evaluation and Treatment: Rehabilitation  ONSET DATE: On or around July 4th, 2nd dislocation  SUBJECTIVE:   SUBJECTIVE STATEMENT: [name] reports consistent gym attendance while PT exercise compliance has been less consistent.  PERTINENT HISTORY: NA  PAIN:  Are you having pain? Yes: NPRS scale: 0-3/10 most of the time with an occasional brief, sharp 5-6/10 Pain location: Medial left knee Pain description: Ache, sore to touch, can get a brief sharp pain Aggravating factors: Squatting, knee flexion Relieving factors: Ice  PRECAUTIONS: Knee  RED FLAGS: None   WEIGHT BEARING RESTRICTIONS: No  FALLS:  Has patient  fallen in last 6 months? Yes. Number of falls 1, related to this episode of PT  LIVING ENVIRONMENT: Lives with: lives with their family Lives in: House/apartment Stairs: Slow and achy but can do them Has following equipment at home: None  OCCUPATION: Geographical information systems officer  PLOF: Independent  PATIENT GOALS: Get confidence back and return to work  NEXT MD VISIT: 06/04/2024 AT 9 AM  OBJECTIVE:  Note: Objective measures were completed at Evaluation unless otherwise noted.  DIAGNOSTIC FINDINGS: 1. Sequelae of recent lateral patellar dislocation with severe osseous contusion of the inferomedial patella and periphery of the anterolateral femoral condyle. Complete tear of the femoral insertion of the MPFL. 2. No meniscal injury of the left knee. 3. Large joint effusion.  PATIENT SURVEYS:  PSFS: THE PATIENT SPECIFIC FUNCTIONAL SCALE  Place score of 0-10 (0 = unable to perform activity and 10 = able to perform activity at the same level as before injury or problem)  Activity Date: 05/12/2024 06/03/2024   Work  4/10 6/10   2.   Running 2/10 5/10   3.   Squatting 4/10 5/10   4.      Total Score 3.33 5.33     Total Score = Sum of activity scores/number of activities  Minimally Detectable Change: 3 points (for single activity); 2 points (for average score)  [name] (nd). The Patient Specific Functional Scale . Retrieved from [email or web address]   COGNITION: Overall cognitive status: Within functional limits for tasks assessed  SENSATION: WFL  EDEMA:  Noted and not assessed  LOWER EXTREMITY ROM:  Active ROM Left/Right 05/12/2024 Left/Right 06/03/2024  Hip flexion    Hip extension    Hip abduction    Hip adduction    Hip internal rotation    Hip external rotation    Knee flexion 122/130 124/126  Knee extension -3/-1 -3/-1  Ankle dorsiflexion    Ankle plantarflexion    Ankle inversion    Ankle eversion     Hamstrings 35/40 40/40  Quadriceps 110/120 110/120  Ober's Test +/+ +/+ but better than evaluation   (Blank rows = not tested)  LOWER EXTREMITY STRENGTH:  In pounds with hand-held dynamometer Left/Right 05/12/2024 Left/Right 06/03/2024  Hip flexion    Hip extension    Hip abduction    Hip adduction    Hip internal rotation    Hip external rotation    Knee flexion    Knee extension 67.1/79.4 77.7/84.0  Ankle dorsiflexion    Ankle plantarflexion    Ankle inversion    Ankle eversion     (Blank rows = not tested)  GAIT: Distance walked: 100 feet Assistive device utilized: None Level of assistance: Complete Independence Comments: 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, theatre/television/film                                                                                                                                TREATMENT DATE:  06/03/2024 Supine ITB stretch 5 x 20 seconds Seated straight leg raises 4 sets of 10 with 3# Knee extension machine 90-40 degrees (up with both, down left only with slow eccentrics) 35# 20 x   97535: Reviewed the importance of frequent home program participation, particularly in regards to IT band stretching; reviewed the importance of quadriceps focused strengthening at home and at the gym; reviewed today's reassessment findings and recommendations to continue supervised physical therapy through August   05/21/2024 TherEx:  Treadmill walking 2. for 6 minutes used as warm up TKE with red TB 1x10 with 3 second hold; with blue TB 2x10 with 3 second hold  Step ups in parallel bars with 6 step using L LE only 2x10 with 2-3 second hold at top of movement   attempted no UE use on parallel bars but had increase in inferior knee pain 2/2 decreased balance Leg press squats B up, down with L LE only with focus on slow eccentric motion 2x12 Single leg leg extension with L LE only 2x13 Supine IT band stretch with L LE only 3x30s    05/12/2024  Supine ITB stretch 4 x 20  seconds Seated straight leg raises 3 sets of 10 with 3#  57535: Reviewed imaging, knee anatomy as it relates to patellar dislocations, examination findings and day 1 home exercise program   PATIENT EDUCATION:  Education details: See above Person educated: Patient Education method: See above Education comprehension: verbalized understanding, returned demonstration, verbal cues  required, tactile cues required, and needs further education  HOME EXERCISE PROGRAM: Access Code: [email or web address] URL: [email or web address] Date: 05/12/2024 Prepared by: Lamar Ivory  Exercises - Supine ITB Stretch with Strap  - 2-3 x daily - 7 x weekly - 1 sets - 5 reps - 20 seconds hold - Seated Straight Leg Raise  - 2 x daily - 7 x weekly - 5 sets - 10 reps - 3 seconds hold  ASSESSMENT:  CLINICAL IMPRESSION: Ayaansh notes no dislocations since starting PT.  He has horrible IT Band flexibility, although improved since evaluation.  Quadriceps strength will also need to improve to avoid future dislocations.  Ying has been going to the gym daily, but he will benefit from more consistent PT exercise compliance as these activities are more targeted towards his impairment areas.  If he is able to improve his home exercise program compliance, Torry should be able to meet long-term goals within the current plan of care.  OBJECTIVE IMPAIRMENTS: Abnormal gait, cardiopulmonary status limiting activity, decreased activity tolerance, decreased endurance, decreased knowledge of condition, difficulty walking, decreased ROM, decreased strength, decreased safety awareness, increased edema, impaired flexibility, and pain.   ACTIVITY LIMITATIONS: carrying, lifting, bending, standing, squatting, stairs, and locomotion level  PARTICIPATION LIMITATIONS: cleaning, community activity, and occupation  PERSONAL FACTORS: No personal factors are affecting this patient's functional outcome.   REHAB POTENTIAL: Good  CLINICAL  DECISION MAKING: Stable/uncomplicated  EVALUATION COMPLEXITY: Low   GOALS: Goals reviewed with patient? Yes  SHORT TERM GOALS: Target date: 06/09/2024 Augustin will be independent with his day 1 HEP Baseline: Started 05/12/2024 Goal status: On Going 06/03/2024  2.  Improve left knee flexion active range of motion to 130 degrees Baseline: 122 degrees Goal status: On Going 06/03/2024  3.  Improve left quadriceps strength as assessed by objective measures and functional self-report Baseline: 67.1 pounds Goal status: Met 06/03/2024   LONG TERM GOALS: Target date: 07/07/2024  Improve Patient Specific Functional Scale to at least 7 Baseline: 3.33 Goal status: On Going (5.33) 06/03/2024  2.  Ramil will report left knee pain consistently 0/10 on the Visual Analog Scale Baseline:  Goal status: On Going 06/03/2024  3.  Improve bilateral quadriceps strength to at least 90 pounds Baseline: 67.1/79.4 pounds Goal status: On Going 06/03/2024  4.  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  Emarion will be independent with his long-term maintenance HEP at [place] Baseline: Started 05/12/2024 Goal status: INITIAL   PLAN:  PT FREQUENCY: 1-2x/week  PT DURATION: Up to 07/07/2024  PLANNED INTERVENTIONS: 97750- Physical Performance Testing, 97110-Therapeutic exercises, 97530- Therapeutic activity, 97112- Neuromuscular re-education, 97535- Self Care, 02859- Manual therapy, [phone]- Gait training, [phone]- Electrical stimulation (unattended), 97016- Vasopneumatic device, Patient/Family education, Balance training, Stair training, and Cryotherapy  PLAN FOR NEXT SESSION: Quadriceps strengthening and IT Band stretching   Myer LELON Ivory, PT, MPT 06/03/24 1:59 PM

## 2024-06-04 ENCOUNTER — Ambulatory Visit: Admitting: Orthopaedic Surgery

## 2024-06-04 ENCOUNTER — Encounter: Payer: Self-pay | Admitting: Orthopaedic Surgery

## 2024-06-04 DIAGNOSIS — S83005A Unspecified dislocation of left patella, initial encounter: Secondary | ICD-10-CM | POA: Diagnosis not present

## 2024-06-04 NOTE — Progress Notes (Signed)
   Office Visit Note   Patient: Ray Jimenez           Date of Birth: Apr 02, 2001           MRN: [id number] Visit Date: 06/04/2024              Requested by: Gordan Eleanor GAILS, MD [address] [place],  [place] [address] PCP: Gordan Eleanor GAILS, MD   Assessment & Plan: Visit Diagnoses:  1. Patellar dislocation, left, initial encounter     Plan: History of Present Illness Ray Jimenez is a 23 year old male who presents for follow-up evaluation of left patellar dislocation.  He reports overall improvement after physical therapy and home exercises, with two weeks remaining in his therapy program. He experiences improved flexibility but continues to feel knee instability, especially during activities like shooting basketballs. He felt his kneecap wobble while wearing a knee brace, which led him to stop the activity. He is concerned about the recurrence of instability. He occasionally wears a knee brace but does not use tape for support.  Exam of the left knee is unremarkable.  Assessment and Plan Patellar instability of knee Patellar instability improved with physical therapy and exercises. Occasional instability during activities despite knee brace. Surgery not immediately necessary but may be considered if instability recurs or causes apprehension. - Continue physical therapy for two weeks. - Gradually increase activity as tolerated. - Consider surgery if instability recurs or causes apprehension. - Wear knee brace during activities.  Follow-Up Instructions: No follow-ups on file.   Orders:  No orders of the defined types were placed in this encounter.  No orders of the defined types were placed in this encounter.     Procedures: No procedures performed   Clinical Data: No additional findings.   Subjective: Chief Complaint  Patient presents with   Left Knee - Follow-up    Patellar dislocation    HPI  Review of Systems   Objective: Vital Signs: There were no  vitals taken for this visit.  Physical Exam  Ortho Exam  Specialty Comments:  No specialty comments available.  Imaging: No results found.   PMFS History: There are no active problems to display for this patient.  Past Medical History:  Diagnosis Date   Hydrocele     Family History  Problem Relation Age of Onset   Allergies Mother    Ovarian cancer Maternal Grandmother    Diabetes Paternal Grandmother    Hypertension Paternal Grandmother     History reviewed. No pertinent surgical history. Social History   Occupational History   Not on file  Tobacco Use   Smoking status: Some Days    Types: Cigars   Smokeless tobacco: Never  Vaping Use   Vaping status: Never Used  Substance and Sexual Activity   Alcohol use: Yes   Drug use: Not Currently    Types: Marijuana   Sexual activity: Not on file

## 2024-06-07 ENCOUNTER — Telehealth: Payer: Self-pay | Admitting: Orthopaedic Surgery

## 2024-06-07 NOTE — Telephone Encounter (Signed)
 Patient is requesting FMLA for intermittent leave for flare-ups. Please advise if okay. Thank you!

## 2024-06-07 NOTE — Telephone Encounter (Signed)
 yes

## 2024-06-07 NOTE — Telephone Encounter (Signed)
 Completed. Patient advised ready to pick up.

## 2024-06-08 ENCOUNTER — Ambulatory Visit (INDEPENDENT_AMBULATORY_CARE_PROVIDER_SITE_OTHER): Admitting: Rehabilitative and Restorative Service Providers"

## 2024-06-08 ENCOUNTER — Encounter: Payer: Self-pay | Admitting: Rehabilitative and Restorative Service Providers"

## 2024-06-08 DIAGNOSIS — M6281 Muscle weakness (generalized): Secondary | ICD-10-CM | POA: Diagnosis not present

## 2024-06-08 DIAGNOSIS — R262 Difficulty in walking, not elsewhere classified: Secondary | ICD-10-CM | POA: Diagnosis not present

## 2024-06-08 DIAGNOSIS — M25562 Pain in left knee: Secondary | ICD-10-CM | POA: Diagnosis not present

## 2024-06-08 DIAGNOSIS — M25662 Stiffness of left knee, not elsewhere classified: Secondary | ICD-10-CM

## 2024-06-08 DIAGNOSIS — R600 Localized edema: Secondary | ICD-10-CM | POA: Diagnosis not present

## 2024-06-08 NOTE — Therapy (Signed)
 OUTPATIENT PHYSICAL THERAPY LOWER EXTREMITY TREATMENT NOTE    Patient Name: Ray Jimenez MRN: [id number] DOB:03-14-2001, 23 y.o., male Today's Date: 06/08/2024  END OF SESSION:  PT End of Session - 06/08/24 1102     Visit Number 4    Number of Visits 16    Date for PT Re-Evaluation 07/07/24    Authorization Type BCBS    Progress Note Due on Visit 16    PT Start Time 1101    PT Stop Time 1145    PT Time Calculation (min) 44 min    Activity Tolerance Patient tolerated treatment well;No increased pain;Patient limited by pain    Behavior During Therapy [facility] for tasks assessed/performed             Past Medical History:  Diagnosis Date   Hydrocele    History reviewed. No pertinent surgical history. There are no active problems to display for this patient.   PCP: Ray LULLA Holt, MD  REFERRING PROVIDER: Kay CHRISTELLA Cummins, MD  REFERRING DIAG: [phone] (ICD-10-CM) - Patellar dislocation, left, initial encounter  THERAPY DIAG:  Localized edema  Muscle weakness (generalized)  Difficulty in walking, not elsewhere classified  Acute pain of left knee  Stiffness of left knee, not elsewhere classified  Rationale for Evaluation and Treatment: Rehabilitation  ONSET DATE: On or around July 4th, 2nd dislocation  SUBJECTIVE:   SUBJECTIVE STATEMENT: [name] reports he has returned to work with some overall soreness by his 3rd consecutive day at work.  He continues to have consistent gym attendance while PT exercise compliance remains less consistent.  PERTINENT HISTORY: NA  PAIN:  Are you having pain? Yes: NPRS scale: 1-4/10 most of the time with an occasional brief, sharp 6/10 Pain location: Medial left knee Pain description: Ache, less sore to touch, can get a brief sharp pain Aggravating factors: Squatting, knee flexion Relieving factors: Ice  PRECAUTIONS: Knee  RED FLAGS: None   WEIGHT BEARING RESTRICTIONS: No  FALLS:  Has patient fallen in last 6 months? Yes.  Number of falls 1, related to this episode of PT  LIVING ENVIRONMENT: Lives with: lives with their family Lives in: House/apartment Stairs: Slow and achy but can do them Has following equipment at home: None  OCCUPATION: Geographical information systems officer  PLOF: Independent  PATIENT GOALS: Get confidence back and return to work  NEXT MD VISIT: Nothing scheduled  OBJECTIVE:  Note: Objective measures were completed at Evaluation unless otherwise noted.  DIAGNOSTIC FINDINGS: 1. Sequelae of recent lateral patellar dislocation with severe osseous contusion of the inferomedial patella and periphery of the anterolateral femoral condyle. Complete tear of the femoral insertion of the MPFL. 2. No meniscal injury of the left knee. 3. Large joint effusion.  PATIENT SURVEYS:  PSFS: THE PATIENT SPECIFIC FUNCTIONAL SCALE  Place score of 0-10 (0 = unable to perform activity and 10 = able to perform activity at the same level as before injury or problem)  Activity Date: 05/12/2024 06/03/2024   Work  4/10 6/10   2.   Running 2/10 5/10   3.   Squatting 4/10 5/10   4.      Total Score 3.33 5.33     Total Score = Sum of activity scores/number of activities  Minimally Detectable Change: 3 points (for single activity); 2 points (for average score)  [name] (nd). The Patient Specific Functional Scale . Retrieved from [email or web address]   COGNITION: Overall cognitive status: Within functional limits for tasks assessed  SENSATION: WFL  EDEMA:  Noted and not assessed  LOWER EXTREMITY ROM:  Active ROM Left/Right 05/12/2024 Left/Right 06/03/2024  Hip flexion    Hip extension    Hip abduction    Hip adduction    Hip internal rotation    Hip external rotation    Knee flexion 122/130 124/126  Knee extension -3/-1 -3/-1  Ankle dorsiflexion    Ankle plantarflexion    Ankle inversion    Ankle eversion    Hamstrings 35/40 40/40   Quadriceps 110/120 110/120  Ober's Test +/+ +/+ but better than evaluation   (Blank rows = not tested)  LOWER EXTREMITY STRENGTH:  In pounds with hand-held dynamometer Left/Right 05/12/2024 Left/Right 06/03/2024  Hip flexion    Hip extension    Hip abduction    Hip adduction    Hip internal rotation    Hip external rotation    Knee flexion    Knee extension 67.1/79.4 77.7/84.0  Ankle dorsiflexion    Ankle plantarflexion    Ankle inversion    Ankle eversion     (Blank rows = not tested)  GAIT: Distance walked: 100 feet Assistive device utilized: None Level of assistance: Complete Independence Comments: 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, theatre/television/film                                                                                                                                TREATMENT DATE:  06/08/2024 Recumbent bike Seat 5 Fartlek training (10 MPH for :50 seconds then sprint 20+ MPH for :10 seconds) Supine ITB stretch 5 x 20 seconds Seated straight leg raises 4 sets of 10 with 3# Knee extension machine 90-40 degrees (up with both, down left only with slow eccentrics) 45# 10 x   Functional Activities: Single Leg Press: Left only 2 sets of 15 with 100# slow eccentrics Step-down off 4 inch step (use hands as needed) 10 x focus on slow eccentrics  Neuromuscular re-education: Single Leg Balance 10 x 10 seconds   06/03/2024 Supine ITB stretch 5 x 20 seconds Seated straight leg raises 4 sets of 10 with 3# Knee extension machine 90-40 degrees (up with both, down left only with slow eccentrics) 35# 20 x   [id number]: Reviewed the importance of frequent home program participation, particularly in regards to IT band stretching; reviewed the importance of quadriceps focused strengthening at home and at the gym; reviewed today's reassessment findings and recommendations to continue supervised physical therapy through August   05/21/2024 TherEx:  Treadmill walking 2. for 6 minutes used  as warm up TKE with red TB 1x10 with 3 second hold; with blue TB 2x10 with 3 second hold  Step ups in parallel bars with 6 step using L LE only 2x10 with 2-3 second hold at top of movement   attempted no UE use on parallel bars but had increase in inferior knee pain 2/2 decreased balance Leg press squats B  up, down with L LE only with focus on slow eccentric motion 2x12 Single leg leg extension with L LE only 2x13 Supine IT band stretch with L LE only 3x30s    PATIENT EDUCATION:  Education details: See above Person educated: Patient Education method: See above Education comprehension: verbalized understanding, returned demonstration, verbal cues required, tactile cues required, and needs further education  HOME EXERCISE PROGRAM: Access Code: [email or web address] URL: [email or web address] Date: 05/12/2024 Prepared by: Lamar Ivory  Exercises - Supine ITB Stretch with Strap  - 2-3 x daily - 7 x weekly - 1 sets - 5 reps - 20 seconds hold - Seated Straight Leg Raises  - 2 x daily - 7 x weekly - 5 sets - 10 reps - 3 seconds hold  ASSESSMENT:  CLINICAL IMPRESSION: Neithan notes no dislocations since starting PT, although he is wearing his brace at work.  He has been encouraged to be more consistent with his IT Band flexibility and quadriceps work at home.  Ona knows quadriceps strength gains will be needed to avoid future dislocations.  If he is able to improve his home exercise program compliance, Shaarav should be able to meet long-term goals within the current plan of care.  OBJECTIVE IMPAIRMENTS: Abnormal gait, cardiopulmonary status limiting activity, decreased activity tolerance, decreased endurance, decreased knowledge of condition, difficulty walking, decreased ROM, decreased strength, decreased safety awareness, increased edema, impaired flexibility, and pain.   ACTIVITY LIMITATIONS: carrying, lifting, bending, standing, squatting, stairs, and locomotion level  PARTICIPATION  LIMITATIONS: cleaning, community activity, and occupation  PERSONAL FACTORS: No personal factors are affecting this patient's functional outcome.   REHAB POTENTIAL: Good  CLINICAL DECISION MAKING: Stable/uncomplicated  EVALUATION COMPLEXITY: Low   GOALS: Goals reviewed with patient? Yes  SHORT TERM GOALS: Target date: 06/09/2024 Ahmon will be independent with his day 1 HEP Baseline: Started 05/12/2024 Goal status: On Going 06/08/2024  2.  Improve left knee flexion active range of motion to 130 degrees Baseline: 122 degrees Goal status: On Going 06/03/2024  3.  Improve left quadriceps strength as assessed by objective measures and functional self-report Baseline: 67.1 pounds Goal status: Met 06/03/2024   LONG TERM GOALS: Target date: 07/07/2024  Improve Patient Specific Functional Scale to at least 7 Baseline: 3.33 Goal status: On Going (5.33) 06/03/2024  2.  Shon will report left knee pain consistently 0/10 on the Visual Analog Scale Baseline:  Goal status: On Going 06/08/2024  3.  Improve bilateral quadriceps strength to at least 90 pounds Baseline: 67.1/79.4 pounds Goal status: On Going 06/03/2024  4.  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  Curly will be independent with his long-term maintenance HEP at [place] Baseline: Started 05/12/2024 Goal status: INITIAL   PLAN:  PT FREQUENCY: 1-2x/week  PT DURATION: Up to 07/07/2024  PLANNED INTERVENTIONS: 97750- Physical Performance Testing, 97110-Therapeutic exercises, 97530- Therapeutic activity, 97112- Neuromuscular re-education, 97535- Self Care, [id number]- Manual therapy, [id number]- Gait training, [phone]- Electrical stimulation (unattended), 97016- Vasopneumatic device, Patient/Family education, Balance training, Stair training, and Cryotherapy  PLAN FOR NEXT SESSION: Quadriceps strengthening and IT  Band stretching.   Myer LELON Ivory, PT, MPT 06/08/24 11:47 AM

## 2024-06-17 ENCOUNTER — Encounter: Payer: Self-pay | Admitting: Rehabilitative and Restorative Service Providers"

## 2024-06-17 ENCOUNTER — Telehealth: Payer: Self-pay | Admitting: Orthopaedic Surgery

## 2024-06-17 ENCOUNTER — Ambulatory Visit (INDEPENDENT_AMBULATORY_CARE_PROVIDER_SITE_OTHER): Admitting: Rehabilitative and Restorative Service Providers"

## 2024-06-17 DIAGNOSIS — R600 Localized edema: Secondary | ICD-10-CM

## 2024-06-17 DIAGNOSIS — M25662 Stiffness of left knee, not elsewhere classified: Secondary | ICD-10-CM

## 2024-06-17 DIAGNOSIS — R262 Difficulty in walking, not elsewhere classified: Secondary | ICD-10-CM

## 2024-06-17 DIAGNOSIS — M6281 Muscle weakness (generalized): Secondary | ICD-10-CM | POA: Diagnosis not present

## 2024-06-17 DIAGNOSIS — M25562 Pain in left knee: Secondary | ICD-10-CM

## 2024-06-17 NOTE — Therapy (Signed)
 OUTPATIENT PHYSICAL THERAPY LOWER EXTREMITY TREATMENT/PROGRESS NOTE    Patient Name: LATRAVIS GRINE MRN: [id number] DOB:[birth year], 23 y.o., male Today's Date: 06/17/2024  END OF SESSION:  PT End of Session - 06/17/24 1148     Visit Number 5    Number of Visits 16    Date for PT Re-Evaluation 07/07/24    Authorization Type BCBS    Progress Note Due on Visit 16    PT Start Time 1147    PT Stop Time 1230    PT Time Calculation (min) 43 min    Activity Tolerance Patient tolerated treatment well;No increased pain;Patient limited by pain    Behavior During Therapy [facility] for tasks assessed/performed              Past Medical History:  Diagnosis Date   Hydrocele    History reviewed. No pertinent surgical history. There are no active problems to display for this patient.   PCP: Eleanor LULLA Holt, MD  REFERRING PROVIDER: Kay CHRISTELLA Cummins, MD  REFERRING DIAG: [phone] (ICD-10-CM) - Patellar dislocation, left, initial encounter  THERAPY DIAG:  Localized edema  Muscle weakness (generalized)  Difficulty in walking, not elsewhere classified  Acute pain of left knee  Stiffness of left knee, not elsewhere classified  Rationale for Evaluation and Treatment: Rehabilitation  ONSET DATE: On or around July 4th, 2nd dislocation  SUBJECTIVE:   SUBJECTIVE STATEMENT: Harrie reports he now has better endurance and comfort at work before occasional brief, sharp pains.  He continues to have consistent gym attendance while PT exercise compliance is improving.  PERTINENT HISTORY: NA  PAIN:  Are you having pain? Yes: NPRS scale: 2-3/10 most of the time with an occasional brief, sharp 6/10 Pain location: Medial left knee Pain description: Ache, less sore to touch, can get a brief sharp pain Aggravating factors: Squatting, knee flexion Relieving factors: Ice  PRECAUTIONS: Knee  RED FLAGS: None   WEIGHT BEARING RESTRICTIONS: No  FALLS:  Has patient fallen in last 6 months? Yes.  Number of falls 1, related to this episode of PT  LIVING ENVIRONMENT: Lives with: lives with their family Lives in: House/apartment Stairs: Slow and achy but can do them Has following equipment at home: None  OCCUPATION: Geographical information systems officer  PLOF: Independent  PATIENT GOALS: Get confidence back and return to work  NEXT MD VISIT: Nothing scheduled  OBJECTIVE:  Note: Objective measures were completed at Evaluation unless otherwise noted.  DIAGNOSTIC FINDINGS: 1. Sequelae of recent lateral patellar dislocation with severe osseous contusion of the inferomedial patella and periphery of the anterolateral femoral condyle. Complete tear of the femoral insertion of the MPFL. 2. No meniscal injury of the left knee. 3. Large joint effusion.  PATIENT SURVEYS:  PSFS: THE PATIENT SPECIFIC FUNCTIONAL SCALE  Place score of 0-10 (0 = unable to perform activity and 10 = able to perform activity at the same level as before injury or problem)  Activity Date: 05/12/2024 06/03/2024 06/17/2024  Work  4/10 6/10 7/10  2.   Running 2/10 5/10 7/10  3.   Squatting 4/10 5/10 7/10  4.      Total Score 3.33 5.33 7    Total Score = Sum of activity scores/number of activities  Minimally Detectable Change: 3 points (for single activity); 2 points (for average score)  [name] (nd). The Patient Specific Functional Scale . Retrieved from [email or web address]   COGNITION: Overall cognitive status: Within functional limits for tasks assessed     SENSATION: [facility]  EDEMA:  Noted and not assessed  LOWER EXTREMITY ROM:  Active ROM Left/Right 05/12/2024 Left/Right 06/03/2024 Left/Right 06/17/2024  Hip flexion     Hip extension     Hip abduction     Hip adduction     Hip internal rotation     Hip external rotation     Knee flexion 122/130 124/126 126/130  Knee extension -3/-1 -3/-1 -3/-4  Ankle dorsiflexion     Ankle plantarflexion      Ankle inversion     Ankle eversion     Hamstrings 35/40 40/40 45/45   Quadriceps 110/120 110/120 110/120  Ober's Test +/+ +/+ but better than evaluation    (Blank rows = not tested)  LOWER EXTREMITY STRENGTH:  In pounds with hand-held dynamometer Left/Right 05/12/2024 Left/Right 06/03/2024 Left/Right 06/17/2024  Hip flexion     Hip extension     Hip abduction     Hip adduction     Hip internal rotation     Hip external rotation     Knee flexion     Knee extension 67.1/79.4 77.7/84.0 88.8/deferred  Ankle dorsiflexion     Ankle plantarflexion     Ankle inversion     Ankle eversion      (Blank rows = not tested)  GAIT: Distance walked: 100 feet Assistive device utilized: None Level of assistance: Complete Independence Comments: 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, theatre/television/film                                                                                                                                TREATMENT DATE:  06/17/2024 Supine ITB stretch 5 x 20 seconds Seated straight leg raises 4 sets of 10 with 3# Prone quadriceps stretch 5 x 20 seconds  Functional Activities: Testing for squatting/running/functional activities for work  Neuromuscular re-education: Single Leg Balance 10 x 10 seconds   06/08/2024 Recumbent bike Seat 5 Fartlek training (10 MPH for :50 seconds then sprint 20+ MPH for :10 seconds) Supine ITB stretch 5 x 20 seconds Seated straight leg raises 4 sets of 10 with 3# Knee extension machine 90-40 degrees (up with both, down left only with slow eccentrics) 45# 10 x   Functional Activities: Single Leg Press: Left only 2 sets of 15 with 100# slow eccentrics Step-down off 4 inch step (use hands as needed) 10 x focus on slow eccentrics  Neuromuscular re-education: Single Leg Balance 10 x 10 seconds   06/03/2024 Supine ITB stretch 5 x 20 seconds Seated straight leg raises 4 sets of 10 with 3# Knee extension machine 90-40 degrees (up with both, down left only  with slow eccentrics) 35# 20 x   97535: Reviewed the importance of frequent home program participation, particularly in regards to IT band stretching; reviewed the importance of quadriceps focused strengthening at home and at the gym; reviewed today's reassessment findings and recommendations to continue supervised physical therapy through August   PATIENT  EDUCATION:  Education details: See above Person educated: Patient Education method: See above Education comprehension: verbalized understanding, returned demonstration, verbal cues required, tactile cues required, and needs further education  HOME EXERCISE PROGRAM: Access Code: [email or web address] URL: [email or web address] Date: 06/17/2024 Prepared by: Lamar Ivory  Exercises - Supine ITB Stretch with Strap  - 2-3 x daily - 7 x weekly - 1 sets - 5 reps - 20 seconds hold - Small Range Straight Leg Raise  - 2 x daily - 7 x weekly - 5 sets - 10 reps - 3 seconds hold - Single Leg Stance  - 1 x daily - 3 x weekly - 1 sets - 10 reps - 10 seconds hold - Lateral Step Down  - 1 x daily - 3 x weekly - 2-3 sets - 10 reps - Prone Quadricep Stretch with Strap  - 1-2 x daily - 7 x weekly - 1 sets - 5 reps - 20 seconds hold  ASSESSMENT:  CLINICAL IMPRESSION: Othello continues to avoid dislocations since starting PT, although he continues to wear his brace at work.  He has been more consistent with his IT Band flexibility and quadriceps strength work at home and the gym.  Tegan knows quadriceps strength gains and improved IT band flexibility will be needed to avoid future dislocations.  Given his improved home exercise program compliance and early progress, Kennen should be able to meet long-term goals within the current plan of care.  OBJECTIVE IMPAIRMENTS: Abnormal gait, cardiopulmonary status limiting activity, decreased activity tolerance, decreased endurance, decreased knowledge of condition, difficulty walking, decreased ROM, decreased strength,  decreased safety awareness, increased edema, impaired flexibility, and pain.   ACTIVITY LIMITATIONS: carrying, lifting, bending, standing, squatting, stairs, and locomotion level  PARTICIPATION LIMITATIONS: cleaning, community activity, and occupation  PERSONAL FACTORS: No personal factors are affecting this patient's functional outcome.   REHAB POTENTIAL: Good  CLINICAL DECISION MAKING: Stable/uncomplicated  EVALUATION COMPLEXITY: Low   GOALS: Goals reviewed with patient? Yes  SHORT TERM GOALS: Target date: 06/09/2024 Lynx will be independent with his day 1 HEP Baseline: Started 05/12/2024 Goal status: Met 06/17/2024  2.  Improve left knee flexion active range of motion to 130 degrees Baseline: 122 degrees Goal status: On Going 06/17/2024  3.  Improve left quadriceps strength as assessed by objective measures and functional self-report Baseline: 67.1 pounds Goal status: Met 06/03/2024   LONG TERM GOALS: Target date: 07/07/2024  Improve Patient Specific Functional Scale to at least 7 Baseline: 3.33 Goal status: Met 06/17/2024  2.  Wright will report left knee pain consistently 0/10 on the Visual Analog Scale Baseline:  Goal status: On Going 06/17/2024  3.  Improve bilateral quadriceps strength to at least 90 pounds Baseline: 67.1/79.4 pounds Goal status: On Going (Very close) 06/17/2024  4.  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  Kavaughn will be independent with his long-term maintenance HEP at [place] Baseline: Started 05/12/2024 Goal status: INITIAL   PLAN:  PT FREQUENCY: 1-2x/week  PT DURATION: Up to 07/07/2024  PLANNED INTERVENTIONS: 97750- Physical Performance Testing, 97110-Therapeutic exercises, 97530- Therapeutic activity, 97112- Neuromuscular re-education, 97535- Self Care, 02859- Manual therapy, [id number]- Gait training, [phone]- Electrical  stimulation (unattended), 97016- Vasopneumatic device, Patient/Family education, Balance training, Stair training, and Cryotherapy  PLAN FOR NEXT SESSION: Quadriceps strengthening and IT Band stretching.  Likely transfer into independent rehabilitation in early September.  Myer LELON Ivory, PT, MPT 06/17/24 6:01 PM

## 2024-06-17 NOTE — Telephone Encounter (Signed)
 Sure - that's fine

## 2024-06-17 NOTE — Telephone Encounter (Signed)
 Patient is asking for FMLA to be completed for 6/5-6/8. His first eval was 04/13/24. He was seen in the ED prior to appt with us . Please advise. Thank you!

## 2024-06-18 NOTE — Telephone Encounter (Signed)
 IC,lmvm advised form is ready to be picked up at front desk.

## 2024-07-01 ENCOUNTER — Encounter: Payer: Self-pay | Admitting: Rehabilitative and Restorative Service Providers"

## 2024-07-01 ENCOUNTER — Ambulatory Visit (INDEPENDENT_AMBULATORY_CARE_PROVIDER_SITE_OTHER): Admitting: Rehabilitative and Restorative Service Providers"

## 2024-07-01 DIAGNOSIS — R262 Difficulty in walking, not elsewhere classified: Secondary | ICD-10-CM | POA: Diagnosis not present

## 2024-07-01 DIAGNOSIS — M6281 Muscle weakness (generalized): Secondary | ICD-10-CM | POA: Diagnosis not present

## 2024-07-01 DIAGNOSIS — M25562 Pain in left knee: Secondary | ICD-10-CM | POA: Diagnosis not present

## 2024-07-01 DIAGNOSIS — M25662 Stiffness of left knee, not elsewhere classified: Secondary | ICD-10-CM

## 2024-07-01 DIAGNOSIS — R600 Localized edema: Secondary | ICD-10-CM | POA: Diagnosis not present

## 2024-07-01 NOTE — Therapy (Signed)
 OUTPATIENT PHYSICAL THERAPY LOWER EXTREMITY TREATMENT NOTE    Patient Name: Ray Jimenez MRN: [id number] DOB:09-Dec-2000, 23 y.o., male Today's Date: 07/01/2024  END OF SESSION:  PT End of Session - 07/01/24 1151     Visit Number 6    Number of Visits 16    Date for PT Re-Evaluation 07/07/24    Authorization Type BCBS    Progress Note Due on Visit 16    PT Start Time 1150    PT Stop Time 1228    PT Time Calculation (min) 38 min    Activity Tolerance Patient tolerated treatment well;No increased pain;Patient limited by fatigue    Behavior During Therapy [facility] for tasks assessed/performed          Past Medical History:  Diagnosis Date   Hydrocele    History reviewed. No pertinent surgical history. There are no active problems to display for this patient.   PCP: Eleanor LULLA Holt, MD  REFERRING PROVIDER: Kay CHRISTELLA Cummins, MD  REFERRING DIAG: [phone] (ICD-10-CM) - Patellar dislocation, left, initial encounter  THERAPY DIAG:  Localized edema  Muscle weakness (generalized)  Difficulty in walking, not elsewhere classified  Acute pain of left knee  Stiffness of left knee, not elsewhere classified  Rationale for Evaluation and Treatment: Rehabilitation  ONSET DATE: On or around July 4th, 2nd dislocation  SUBJECTIVE:   SUBJECTIVE STATEMENT: Ray Jimenez is a bit sore after [place] the park.  He is still going to the gym multiple times per week and reports continued HEP compliance.    PERTINENT HISTORY: NA  PAIN:  Are you having pain? Yes: NPRS scale: 1-3/10 most of the time with an occasional brief, sharp 6/10 1-2 x a day Pain location: Medial left knee Pain description: Ache, continues to be less sore to touch, can get a brief sharp pain Aggravating factors: Squatting, knee flexion Relieving factors: Ice  PRECAUTIONS: Knee  RED FLAGS: None   WEIGHT BEARING RESTRICTIONS: No  FALLS:  Has patient fallen in last 6 months? Yes. Number of falls 1, related to  this episode of PT  LIVING ENVIRONMENT: Lives with: lives with their family Lives in: House/apartment Stairs: Slow and achy but can do them Has following equipment at home: None  OCCUPATION: Geographical information systems officer  PLOF: Independent  PATIENT GOALS: Get confidence back and return to work  NEXT MD VISIT: Nothing scheduled  OBJECTIVE:  Note: Objective measures were completed at Evaluation unless otherwise noted.  DIAGNOSTIC FINDINGS: 1. Sequelae of recent lateral patellar dislocation with severe osseous contusion of the inferomedial patella and periphery of the anterolateral femoral condyle. Complete tear of the femoral insertion of the MPFL. 2. No meniscal injury of the left knee. 3. Large joint effusion.  PATIENT SURVEYS:  PSFS: THE PATIENT SPECIFIC FUNCTIONAL SCALE  Place score of 0-10 (0 = unable to perform activity and 10 = able to perform activity at the same level as before injury or problem)  Activity Date: 05/12/2024 06/03/2024 06/17/2024  Work  4/10 6/10 7/10  2.   Running 2/10 5/10 7/10  3.   Squatting 4/10 5/10 7/10  4.      Total Score 3.33 5.33 7    Total Score = Sum of activity scores/number of activities  Minimally Detectable Change: 3 points (for single activity); 2 points (for average score)  [name] (nd). The Patient Specific Functional Scale . Retrieved from [email or web address]   COGNITION: Overall cognitive status: Within functional limits for tasks assessed  SENSATION: WFL  EDEMA:  Noted and not assessed  LOWER EXTREMITY ROM:  Active ROM Left/Right 05/12/2024 Left/Right 06/03/2024 Left/Right 06/17/2024  Hip flexion     Hip extension     Hip abduction     Hip adduction     Hip internal rotation     Hip external rotation     Knee flexion 122/130 124/126 126/130  Knee extension -3/-1 -3/-1 -3/-4  Ankle dorsiflexion     Ankle plantarflexion     Ankle inversion     Ankle  eversion     Hamstrings 35/40 40/40 45/45   Quadriceps 110/120 110/120 110/120  Ober's Test +/+ +/+ but better than evaluation    (Blank rows = not tested)  LOWER EXTREMITY STRENGTH:  In pounds with hand-held dynamometer Left/Right 05/12/2024 Left/Right 06/03/2024 Left/Right 06/17/2024  Hip flexion     Hip extension     Hip abduction     Hip adduction     Hip internal rotation     Hip external rotation     Knee flexion     Knee extension 67.1/79.4 77.7/84.0 88.8/deferred  Ankle dorsiflexion     Ankle plantarflexion     Ankle inversion     Ankle eversion      (Blank rows = not tested)  GAIT: Distance walked: 100 feet Assistive device utilized: None Level of assistance: Complete Independence Comments: 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, theatre/television/film                                                                                                                                TREATMENT DATE:  07/01/2024 Recumbent bike Seat 5 Fartlek training (12 MPH for :50 seconds then sprint 19+ MPH for :10 seconds) for 6 minutes Supine ITB stretch 5 x 20 seconds Knee extension machine 90-40 degrees (up with both, down left only with slow eccentrics) 45# 2 sets of 15 x Prone quadriceps stretch 5 x 20 seconds  Neuromuscular re-education: Single Leg Balance 10 x 10 seconds  Functional Activities (single-leg stance/dynamic balance): Hip abduction with pelvic stabilization 2 sets of 5 for 3 seconds (needs continued PT feedback)   06/17/2024 Supine ITB stretch 5 x 20 seconds Seated straight leg raises 4 sets of 10 with 3# Prone quadriceps stretch 5 x 20 seconds  Functional Activities: Testing for squatting/running/functional activities for work  Neuromuscular re-education: Single Leg Balance 10 x 10 seconds   06/08/2024 Recumbent bike Seat 5 Fartlek training (10 MPH for :50 seconds then sprint 20+ MPH for :10 seconds) Supine ITB stretch 5 x 20 seconds Seated straight leg raises 4 sets of 10  with 3# Knee extension machine 90-40 degrees (up with both, down left only with slow eccentrics) 45# 10 x   Functional Activities: Single Leg Press: Left only 2 sets of 15 with 100# slow eccentrics Step-down off 4 inch step (use hands as needed) 10 x focus on slow eccentrics  Neuromuscular re-education: Single Leg Balance 10 x 10 seconds   PATIENT EDUCATION:  Education details: See above Person educated: Patient Education method: See above Education comprehension: verbalized understanding, returned demonstration, verbal cues required, tactile cues required, and needs further education  HOME EXERCISE PROGRAM: Access Code: [email or web address] URL: [email or web address] Date: 06/17/2024 Prepared by: Lamar Ivory  Exercises - Supine ITB Stretch with Strap  - 2-3 x daily - 7 x weekly - 1 sets - 5 reps - 20 seconds hold - Small Range Straight Leg Raise  - 2 x daily - 7 x weekly - 5 sets - 10 reps - 3 seconds hold - Single Leg Stance  - 1 x daily - 3 x weekly - 1 sets - 10 reps - 10 seconds hold - Lateral Step Down  - 1 x daily - 3 x weekly - 2-3 sets - 10 reps - Prone Quadricep Stretch with Strap  - 1-2 x daily - 7 x weekly - 1 sets - 5 reps - 20 seconds hold  ASSESSMENT:  CLINICAL IMPRESSION: Wilborn continues to avoid dislocations since starting PT and he is weaning off his brace at work.  Quad fatigue was noted at the end of today's treatment and Karanveer knows quadriceps strength gains and improved IT band flexibility will be needed to avoid future dislocations.  We will reassess again next visit for either preparation for transfer into independent PT or a bit more supervised physical therapy.  OBJECTIVE IMPAIRMENTS: Abnormal gait, cardiopulmonary status limiting activity, decreased activity tolerance, decreased endurance, decreased knowledge of condition, difficulty walking, decreased ROM, decreased strength, decreased safety awareness, increased edema, impaired flexibility, and pain.    ACTIVITY LIMITATIONS: carrying, lifting, bending, standing, squatting, stairs, and locomotion level  PARTICIPATION LIMITATIONS: cleaning, community activity, and occupation  PERSONAL FACTORS: No personal factors are affecting this patient's functional outcome.   REHAB POTENTIAL: Good  CLINICAL DECISION MAKING: Stable/uncomplicated  EVALUATION COMPLEXITY: Low   GOALS: Goals reviewed with patient? Yes  SHORT TERM GOALS: Target date: 06/09/2024 Madex will be independent with his day 1 HEP Baseline: Started 05/12/2024 Goal status: Met 06/17/2024  2.  Improve left knee flexion active range of motion to 130 degrees Baseline: 122 degrees Goal status: On Going 06/17/2024  3.  Improve left quadriceps strength as assessed by objective measures and functional self-report Baseline: 67.1 pounds Goal status: Met 06/03/2024   LONG TERM GOALS: Target date: 07/07/2024  Improve Patient Specific Functional Scale to at least 7 Baseline: 3.33 Goal status: Met 06/17/2024  2.  Sharrieff will report left knee pain consistently 0/10 on the Visual Analog Scale Baseline:  Goal status: On Going 07/01/2024  3.  Improve bilateral quadriceps strength to at least 90 pounds Baseline: 67.1/79.4 pounds Goal status: On Going (Very close) 06/17/2024  4.  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  Chancy will be independent with his long-term maintenance HEP at [place] Baseline: Started 05/12/2024 Goal status: On Going 07/01/2024   PLAN:  PT FREQUENCY: 1-2x/week  PT DURATION: Reassess next visit  PLANNED INTERVENTIONS: 97750- Physical Performance Testing, 97110-Therapeutic exercises, 97530- Therapeutic activity, [id number]- Neuromuscular re-education, 97535- Self Care, 02859- Manual therapy, [id number]- Gait training, [phone]- Electrical stimulation (unattended), 97016- Vasopneumatic device,  Patient/Family education, Balance training, Stair training, and Cryotherapy  PLAN FOR NEXT SESSION: Quadriceps strengthening and IT Band stretching.  Ready for DC or additional work needed?  Myer LELON Ivory, PT, MPT 07/01/24 12:31 PM

## 2024-07-08 ENCOUNTER — Encounter: Admitting: Rehabilitative and Restorative Service Providers"

## 2024-07-28 ENCOUNTER — Ambulatory Visit (INDEPENDENT_AMBULATORY_CARE_PROVIDER_SITE_OTHER): Admitting: Rehabilitative and Restorative Service Providers"

## 2024-07-28 ENCOUNTER — Encounter: Payer: Self-pay | Admitting: Rehabilitative and Restorative Service Providers"

## 2024-07-28 DIAGNOSIS — M6281 Muscle weakness (generalized): Secondary | ICD-10-CM

## 2024-07-28 DIAGNOSIS — M25562 Pain in left knee: Secondary | ICD-10-CM

## 2024-07-28 DIAGNOSIS — R262 Difficulty in walking, not elsewhere classified: Secondary | ICD-10-CM

## 2024-07-28 DIAGNOSIS — R600 Localized edema: Secondary | ICD-10-CM | POA: Diagnosis not present

## 2024-07-28 DIAGNOSIS — M25662 Stiffness of left knee, not elsewhere classified: Secondary | ICD-10-CM

## 2024-07-28 NOTE — Therapy (Signed)
 OUTPATIENT PHYSICAL THERAPY LOWER EXTREMITY TREATMENT/PROGRESS NOTE  Progress Note Reporting Period 05/12/2024 to 07/28/2024  See note below for Objective Data and Assessment of Progress/Goals.      Patient Name: Ray Jimenez MRN: [id number] DOB:06/09/2001, 23 y.o., male Today's Date: 07/28/2024  END OF SESSION:  PT End of Session - 07/28/24 1354     Visit Number 7    Number of Visits 16    Date for Recertification  09/03/24    Authorization Type BCBS    Progress Note Due on Visit 16    PT Start Time 1348    PT Stop Time 1429    PT Time Calculation (min) 41 min    Activity Tolerance Patient tolerated treatment well;No increased pain    Behavior During Therapy WFL for tasks assessed/performed           Past Medical History:  Diagnosis Date   Hydrocele    History reviewed. No pertinent surgical history. There are no active problems to display for this patient.   PCP: Eleanor LULLA Holt, MD  REFERRING PROVIDER: Kay CHRISTELLA Cummins, MD  REFERRING DIAG: [phone] (ICD-10-CM) - Patellar dislocation, left, initial encounter  THERAPY DIAG:  Localized edema - Plan: PT plan of care cert/re-cert  Muscle weakness (generalized) - Plan: PT plan of care cert/re-cert  Difficulty in walking, not elsewhere classified - Plan: PT plan of care cert/re-cert  Acute pain of left knee - Plan: PT plan of care cert/re-cert  Stiffness of left knee, not elsewhere classified - Plan: PT plan of care cert/re-cert  Rationale for Evaluation and Treatment: Rehabilitation  ONSET DATE: On or around July 4th, 2nd dislocation  SUBJECTIVE:   SUBJECTIVE STATEMENT: Cristal reports he is using his knee brace less and less often.  He has had some soreness this week with a change in his work duties, but no dislocations or instability.  He still gets occasional sharp pain but it is much less frequent.   PERTINENT HISTORY: NA  PAIN:  Are you having pain? Yes: NPRS scale: 0-3/10 most of the time with an  occasional brief, sharp 6/10 1-2 x a week (was 1-2 x a day) Pain location: Medial left knee Pain description: Ache, continues to be less sore to touch, can get a brief sharp pain, but much less often Aggravating factors: Squatting, knee flexion Relieving factors: Ice PRN  PRECAUTIONS: Knee  RED FLAGS: None   WEIGHT BEARING RESTRICTIONS: No  FALLS:  Has patient fallen in last 6 months? Yes. Number of falls 1, related to this episode of PT  LIVING ENVIRONMENT: Lives with: lives with their family Lives in: House/apartment Stairs: Slow and achy but can do them Has following equipment at home: None  OCCUPATION: Geographical information systems officer  PLOF: Independent  PATIENT GOALS: Get confidence back and return to work  NEXT MD VISIT: Nothing scheduled  OBJECTIVE:  Note: Objective measures were completed at Evaluation unless otherwise noted.  DIAGNOSTIC FINDINGS: 1. Sequelae of recent lateral patellar dislocation with severe osseous contusion of the inferomedial patella and periphery of the anterolateral femoral condyle. Complete tear of the femoral insertion of the MPFL. 2. No meniscal injury of the left knee. 3. Large joint effusion.  PATIENT SURVEYS:  PSFS: THE PATIENT SPECIFIC FUNCTIONAL SCALE  Place score of 0-10 (0 = unable to perform activity and 10 = able to perform activity at the same level as before injury or problem)  Activity Date: 05/12/2024 06/03/2024 06/17/2024 07/28/2024  Work  4/10 6/10 7/10 9/10  2.  Running 2/10 5/10 7/10 7/10  3.   Squatting 4/10 5/10 7/10 7/10  4.       Total Score 3.33 5.33 7 7.67    Total Score = Sum of activity scores/number of activities  Minimally Detectable Change: 3 points (for single activity); 2 points (for average score)  [name] (nd). The Patient Specific Functional Scale . Retrieved from [email or web address]   COGNITION: Overall cognitive status: Within functional  limits for tasks assessed     SENSATION: WFL  EDEMA:  Noted and not assessed  LOWER EXTREMITY ROM:  Active ROM Left/Right 05/12/2024 Left/Right 06/03/2024 Left/Right 06/17/2024 Left/Right 07/28/2024  Hip flexion      Hip extension      Hip abduction      Hip adduction      Hip internal rotation      Hip external rotation      Knee flexion 122/130 124/126 126/130 133/134  Knee extension -3/-1 -3/-1 -3/-4 -3/-4  Ankle dorsiflexion      Ankle plantarflexion      Ankle inversion      Ankle eversion      Hamstrings 35/40 40/40 45/45  48/50  Quadriceps 110/120 110/120 110/120 120/125  Ober's Test +/+ +/+ but better than evaluation  +/+ but better than previous testing   (Blank rows = not tested)  LOWER EXTREMITY STRENGTH:  In pounds with hand-held dynamometer Left/Right 05/12/2024 Left/Right 06/03/2024 Left/Right 06/17/2024  Hip flexion     Hip extension     Hip abduction     Hip adduction     Hip internal rotation     Hip external rotation     Knee flexion     Knee extension 67.1/79.4 77.7/84.0 88.8/deferred  Ankle dorsiflexion     Ankle plantarflexion     Ankle inversion     Ankle eversion      (Blank rows = not tested)  GAIT: Distance walked: 100 feet Assistive device utilized: None Level of assistance: Complete Independence Comments: 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, theatre/television/film                                                                                                                                TREATMENT DATE:  07/28/2024 Supine ITB stretch 5 x 20 seconds Knee extension machine 90-40 degrees (up with both, down left only with slow eccentrics) 45# 2 sets of 15 x Prone quadriceps stretch 5 x 20 seconds  Neuromuscular re-education: Single Leg Balance 10 x 10 seconds  Functional Activities (single-leg stance/dynamic balance): Hip abduction with pelvic stabilization 2 sets of 5 for 3 seconds (needed less PT feedback) Single-leg star reach (stand left and reach  right in 8 directions)   07/01/2024 Recumbent bike Seat 5 Fartlek training (12 MPH for :50 seconds then sprint 19+ MPH for :10 seconds) for 6 minutes Supine ITB stretch 5 x 20 seconds Knee extension machine 90-40 degrees (up  with both, down left only with slow eccentrics) 45# 2 sets of 15 x Prone quadriceps stretch 5 x 20 seconds  Neuromuscular re-education: Single Leg Balance 10 x 10 seconds  Functional Activities (single-leg stance/dynamic balance): Hip abduction with pelvic stabilization 2 sets of 5 for 3 seconds (needs continued PT feedback)   06/17/2024 Supine ITB stretch 5 x 20 seconds Seated straight leg raises 4 sets of 10 with 3# Prone quadriceps stretch 5 x 20 seconds  Functional Activities: Testing for squatting/running/functional activities for work  Neuromuscular re-education: Single Leg Balance 10 x 10 seconds   PATIENT EDUCATION:  Education details: See above Person educated: Patient Education method: See above Education comprehension: verbalized understanding, returned demonstration, verbal cues required, tactile cues required, and needs further education  HOME EXERCISE PROGRAM: Access Code: [email or web address] URL: [email or web address] Date: 07/28/2024 Prepared by: Lamar Ivory  Exercises - Supine ITB Stretch with Strap  - 1 x daily - 7 x weekly - 1 sets - 5 reps - 20 seconds hold - Small Range Straight Leg Raise  - 1 x daily - 3 x weekly - 5 sets - 10 reps - 3 seconds hold - Single Leg Stance  - 1 x daily - 3 x weekly - 1 sets - 10 reps - 10 seconds hold - Lateral Step Down  - 1 x daily - 3 x weekly - 2-3 sets - 10 reps - Prone Quadricep Stretch with Strap  - 1 x daily - 7 x weekly - 1 sets - 5 reps - 20 seconds hold  ASSESSMENT:  CLINICAL IMPRESSION: Landis reports his confidence is slowly improving on a daily basis.  Although he has had no dislocations, he still notes knee soreness and fatigue after his 7 to 12 hour work shift.  Pain is less  frequent but can still be a brief, sharp 6/10.  Objectively, AROM, flexibility and strength are improved.  Wanya was given the option of transition into independent rehabilitation, but he requested a 1 x follow-up in a month as he still rates himself subjectively 70%.  Edd understands that quadriceps strength gains and improved IT band flexibility will be the focus to avoid future dislocations.  We will reassess again in a month.  OBJECTIVE IMPAIRMENTS: Abnormal gait, cardiopulmonary status limiting activity, decreased activity tolerance, decreased endurance, decreased knowledge of condition, difficulty walking, decreased ROM, decreased strength, decreased safety awareness, increased edema, impaired flexibility, and pain.   ACTIVITY LIMITATIONS: carrying, lifting, bending, standing, squatting, stairs, and locomotion level  PARTICIPATION LIMITATIONS: cleaning, community activity, and occupation  PERSONAL FACTORS: No personal factors are affecting this patient's functional outcome.   REHAB POTENTIAL: Good  CLINICAL DECISION MAKING: Stable/uncomplicated  EVALUATION COMPLEXITY: Low   GOALS: Goals reviewed with patient? Yes  SHORT TERM GOALS: Target date: 06/09/2024 Mubarak will be independent with his day 1 HEP Baseline: Started 05/12/2024 Goal status: Met 06/17/2024  2.  Improve left knee flexion active range of motion to 130 degrees Baseline: 122 degrees Goal status: Met 07/28/2024  3.  Improve left quadriceps strength as assessed by objective measures and functional self-report Baseline: 67.1 pounds Goal status: Met 06/03/2024   LONG TERM GOALS: Target date: 09/03/2024  Improve Patient Specific Functional Scale to at least 7 Baseline: 3.33 Goal status: Met 06/17/2024  2.  Braelen will report left knee pain consistently 0/10 on the Visual Analog Scale Baseline:  Goal status: On Going 07/28/2024  3.  Improve bilateral quadriceps strength to at least 90 pounds Baseline: 67.1/79.4  pounds Goal status:  On Going (Very close) 06/17/2024  4.  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  Duvid will be independent with his long-term maintenance HEP at [place] Baseline: Started 05/12/2024 Goal status: On Going 07/28/2024   PLAN:  PT FREQUENCY: 1 x  PT DURATION: Reassess next visit  PLANNED INTERVENTIONS: 97750- Physical Performance Testing, 97110-Therapeutic exercises, 97530- Therapeutic activity, [id number]- Neuromuscular re-education, 97535- Self Care, 02859- Manual therapy, [id number]- Gait training, [phone]- Electrical stimulation (unattended), 97016- Vasopneumatic device, Patient/Family education, Balance training, Stair training, and Cryotherapy  PLAN FOR NEXT SESSION: Quadriceps strengthening and IT Band stretching.  Ready for DC or additional work needed?   Myer LELON Ivory, PT, MPT 07/28/24 3:38 PM

## 2024-09-01 ENCOUNTER — Encounter: Payer: Self-pay | Admitting: Rehabilitative and Restorative Service Providers"

## 2024-09-01 ENCOUNTER — Ambulatory Visit (INDEPENDENT_AMBULATORY_CARE_PROVIDER_SITE_OTHER): Admitting: Rehabilitative and Restorative Service Providers"

## 2024-09-01 DIAGNOSIS — M6281 Muscle weakness (generalized): Secondary | ICD-10-CM

## 2024-09-01 DIAGNOSIS — R262 Difficulty in walking, not elsewhere classified: Secondary | ICD-10-CM | POA: Diagnosis not present

## 2024-09-01 DIAGNOSIS — R600 Localized edema: Secondary | ICD-10-CM | POA: Diagnosis not present

## 2024-09-01 DIAGNOSIS — M25562 Pain in left knee: Secondary | ICD-10-CM | POA: Diagnosis not present

## 2024-09-01 DIAGNOSIS — M25662 Stiffness of left knee, not elsewhere classified: Secondary | ICD-10-CM

## 2024-09-01 NOTE — Therapy (Signed)
 OUTPATIENT PHYSICAL THERAPY LOWER EXTREMITY TREATMENT/PROGRESS NOTE  Progress Note Reporting Period 05/12/2024 to 09/01/2024  See note below for Objective Data and Assessment of Progress/Goals.       Patient Name: Ray Jimenez MRN: [id number] DOB:2001-08-16, 23 y.o., male Today's Date: 09/01/2024  END OF SESSION:  PT End of Session - 09/01/24 1256     Visit Number 8    Number of Visits 16    Date for Recertification  10/15/24    Authorization Type [employer]    Progress Note Due on Visit 16    PT Start Time 1256    PT Stop Time 1340    PT Time Calculation (min) 44 min    Activity Tolerance Patient tolerated treatment well;No increased pain    Behavior During Therapy WFL for tasks assessed/performed           Past Medical History:  Diagnosis Date   Hydrocele    History reviewed. No pertinent surgical history. There are no active problems to display for this patient.   PCP: Eleanor LULLA Holt, MD  REFERRING PROVIDER: Kay CHRISTELLA Cummins, MD  REFERRING DIAG: [id number] (ICD-10-CM) - Patellar dislocation, left, initial encounter  THERAPY DIAG:  Localized edema - Plan: PT plan of care cert/re-cert  Muscle weakness (generalized) - Plan: PT plan of care cert/re-cert  Difficulty in walking, not elsewhere classified - Plan: PT plan of care cert/re-cert  Acute pain of left knee - Plan: PT plan of care cert/re-cert  Stiffness of left knee, not elsewhere classified - Plan: PT plan of care cert/re-cert  Rationale for Evaluation and Treatment: Rehabilitation  ONSET DATE: On or around July 4th, 2nd dislocation  SUBJECTIVE:   SUBJECTIVE STATEMENT: Ray Jimenez still reports occasional pain that lingers for a few seconds.  He notes no particular motions that cause this.  Functionally, he was able to participate in the trampoline park with his brace and no difficulty, just total body soreness.  He is using his knee brace only at the gym and more advanced activities (no longer at work).     PERTINENT HISTORY: NA  PAIN:  Are you having pain? Yes: NPRS scale: 0-2/10 most of the time with an occasional brief, sharp 6/10 1 x a day (was 1-2 x a day) Pain location: Medial left knee Pain description: Mild ache, continues to be less sore to touch, can get a brief sharp pain, but less often Aggravating factors: Better with Squatting, knee flexion, nothing in particular aggravating Relieving factors: Not needed  PRECAUTIONS: Knee  RED FLAGS: None   WEIGHT BEARING RESTRICTIONS: No  FALLS:  Has patient fallen in last 6 months? Yes. Number of falls 1, related to this episode of PT  LIVING ENVIRONMENT: Lives with: lives with their family Lives in: House/apartment Stairs: Slow and achy but can do them Has following equipment at home: None  OCCUPATION: Geographical information systems officer  PLOF: Independent  PATIENT GOALS: Get confidence back and return to work  NEXT MD VISIT: Nothing scheduled  OBJECTIVE:  Note: Objective measures were completed at Evaluation unless otherwise noted.  DIAGNOSTIC FINDINGS: 1. Sequelae of recent lateral patellar dislocation with severe osseous contusion of the inferomedial patella and periphery of the anterolateral femoral condyle. Complete tear of the femoral insertion of the MPFL. 2. No meniscal injury of the left knee. 3. Large joint effusion.  PATIENT SURVEYS:  PSFS: THE PATIENT SPECIFIC FUNCTIONAL SCALE  Place score of 0-10 (0 = unable to perform activity and 10 = able to perform activity at the  same level as before injury or problem)  Activity Date: 05/12/2024 06/03/2024 06/17/2024 07/28/2024 09/01/2024  Work  4/10 6/10 7/10 9/10 10/10  2.   Running 2/10 5/10 7/10 7/10 8/10  3.   Squatting 4/10 5/10 7/10 7/10 8/10  4.        Total Score 3.33 5.33 7 7.67 8.67    Total Score = Sum of activity scores/number of activities  Minimally Detectable Change: 3 points (for single activity); 2 points (for average score)  [facility] (nd).  The Patient Specific Functional Scale . Retrieved from [email or web address]   COGNITION: Overall cognitive status: Within functional limits for tasks assessed     SENSATION: WFL  EDEMA:  Noted and not assessed  LOWER EXTREMITY ROM:  Active ROM Left/Right 05/12/2024 Left/Right 06/03/2024 Left/Right 06/17/2024 Left/Right 07/28/2024 Left/Right 09/01/2024  Hip flexion     90/90  Hip extension       Hip abduction       Hip adduction       Hip internal rotation     20/20  Hip external rotation     30/32  Knee flexion 122/130 124/126 126/130 133/134 140/138  Knee extension -3/-1 -3/-1 -3/-4 -3/-4 -2/-3  Ankle dorsiflexion       Ankle plantarflexion       Ankle inversion       Ankle eversion       Hamstrings 35/40 40/40 45/45  48/50 45/50  Quadriceps 110/120 110/120 110/120 120/125 120/125  Ober's Test +/+ +/+ but better than evaluation  +/+ but better than previous testing +/- but improved   (Blank rows = not tested)  LOWER EXTREMITY STRENGTH:  In pounds with hand-held dynamometer Left/Right 05/12/2024 Left/Right 06/03/2024 Left/Right 06/17/2024  Hip flexion     Hip extension     Hip abduction     Hip adduction     Hip internal rotation     Hip external rotation     Knee flexion     Knee extension 67.1/79.4 77.7/84.0 88.8/deferred  Ankle dorsiflexion     Ankle plantarflexion     Ankle inversion     Ankle eversion      (Blank rows = not tested)  GAIT: Distance walked: 100 feet Assistive device utilized: None Level of assistance: Complete Independence Comments: 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     07/28/2024  Supine ITB stretch 5 x 20 seconds Knee extension machine 90-40 degrees (up with both, down left only with slow eccentrics) 45# 2 sets of 15 x Prone quadriceps stretch 5 x 20 seconds  Neuromuscular re-education: Single Leg Balance 10 x 10 seconds  Functional Activities (single-leg stance/dynamic balance): Hip abduction with pelvic stabilization 2 sets of 5 for 3 seconds (needed less PT feedback) Single-leg star reach (stand left and reach right in 8 directions)   07/01/2024 Recumbent bike Seat 5 Fartlek training (12 MPH for :50 seconds then sprint 19+ MPH for :10 seconds) for 6 minutes Supine ITB stretch 5 x 20 seconds Knee extension machine 90-40 degrees (up with both, down left only with slow eccentrics) 45# 2 sets of 15 x Prone quadriceps stretch 5 x 20 seconds  Neuromuscular re-education: Single Leg Balance 10 x 10 seconds  Functional Activities (single-leg stance/dynamic balance): Hip abduction with pelvic stabilization 2 sets of 5 for 3 seconds (needs continued PT feedback)   PATIENT EDUCATION:  Education details: See above Person educated: Patient Education method: See above Education comprehension: verbalized understanding, returned demonstration, verbal cues required, tactile cues required, and needs further education  HOME EXERCISE PROGRAM: Access Code: [email or web address] URL: [email or web address] Date: 09/01/2024 Prepared by: Lamar Ivory  Exercises - Supine ITB Stretch with Strap  - 1 x daily - 7 x weekly - 1 sets - 5 reps - 20 seconds hold - Small Range Straight Leg Raise  - 1 x daily - 3 x weekly - 5 sets - 10 reps - 3 seconds hold - Single Leg Stance  - 1 x daily - 3 x weekly - 1 sets - 10 reps - 10 seconds hold - Lateral Step Down  - 1  x daily - 2-3 x weekly - 2-3 sets - 10 reps - Prone Quadricep Stretch with Strap  - 1 x daily - 7 x weekly - 1 sets - 5 reps - 20 seconds hold - Supine Figure 4 Piriformis Stretch  - 1 x daily - 3-7 x weekly - 1 sets - 5 reps - 20 seconds hold - Hip Flexor Stretch with Chair  - 1 x daily - 3-7 x weekly - 1 sets - 5 reps - 20 seconds hold  ASSESSMENT:  CLINICAL IMPRESSION: Ray Jimenez reports he continues to be more comfortable without the brace.  He is still concerned about the daily sharp pain he gets, although he admits he is doing way more physically than a month ago.  I believe continued consistent stretching (he is naturally a bit stiff, particularly with his left IT Band) along with 3 x a week quadriceps strength work should allow Ray Jimenez to meet all long-term goals.  He would like to attend 1 more visit in 4-6 weeks for reassessment before hopeful transition into independent rehabilitation.    OBJECTIVE IMPAIRMENTS: Abnormal gait, cardiopulmonary status limiting activity, decreased activity tolerance, decreased endurance, decreased knowledge of condition, difficulty walking, decreased ROM, decreased strength, decreased safety awareness, increased edema, impaired flexibility, and pain.   ACTIVITY LIMITATIONS: carrying, lifting, bending, standing, squatting, stairs, and locomotion level  PARTICIPATION LIMITATIONS: cleaning, community activity, and occupation  PERSONAL FACTORS: No personal factors are affecting this patient's functional outcome.   REHAB POTENTIAL: Good  CLINICAL DECISION MAKING: Stable/uncomplicated  EVALUATION COMPLEXITY: Low   GOALS: Goals reviewed with patient? Yes  SHORT TERM GOALS: Target date: 06/09/2024 Ray Jimenez will be independent with his day 1 HEP Baseline: Started 05/12/2024 Goal status: Met 06/17/2024  2.  Improve left knee flexion  active range of motion to 130 degrees Baseline: 122 degrees Goal status: Met 07/28/2024  3.  Improve left quadriceps strength as assessed  by objective measures and functional self-report Baseline: 67.1 pounds Goal status: Met 06/03/2024   LONG TERM GOALS: Target date: 10/15/2024  Improve Patient Specific Functional Scale to at least 7 Baseline: 3.33 Goal status: Met 06/17/2024  2.  Ray Jimenez will report left knee pain consistently 0/10 on the Visual Analog Scale Baseline:  Goal status: On Going 09/01/2024  3.  Improve bilateral quadriceps strength to at least 90 pounds Baseline: 67.1/79.4 pounds Goal status: On Going (Very close) 06/17/2024  4.  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  Ray Jimenez will be independent with his long-term maintenance HEP at DC Baseline: Started 05/12/2024 Goal status: On Going 09/01/2024   PLAN:  PT FREQUENCY: 1 x  PT DURATION: Reassess next visit  PLANNED INTERVENTIONS: 97750- Physical Performance Testing, 97110-Therapeutic exercises, 97530- Therapeutic activity, [id number]- Neuromuscular re-education, 97535- Self Care, 02859- Manual therapy, [id number]- Gait training, [phone]- Electrical stimulation (unattended), 97016- Vasopneumatic device, Patient/Family education, Balance training, Stair training, and Cryotherapy  PLAN FOR NEXT SESSION: Quadriceps strengthening and IT Band stretching.  Ready for DC or additional work needed?  Likely DC.   Myer LELON Ivory, PT, MPT   09/01/24 3:24 PM

## 2024-10-06 ENCOUNTER — Telehealth: Payer: Self-pay | Admitting: Rehabilitative and Restorative Service Providers"

## 2024-10-06 ENCOUNTER — Encounter: Admitting: Rehabilitative and Restorative Service Providers"

## 2024-10-06 NOTE — Telephone Encounter (Signed)
 Spoke with [place].  He forgot his appointment today.  He is doing well and will possibly schedule 1 more visit before the end of 2025.  If no visit by 2026, he is OK with being discharged.

## 2024-11-24 ENCOUNTER — Telehealth: Payer: Self-pay | Admitting: Orthopaedic Surgery

## 2024-11-24 NOTE — Telephone Encounter (Signed)
 Patient is requesting extension for intermittent leave. Please advise if okay. Thank you!

## 2024-11-25 NOTE — Telephone Encounter (Signed)
 IC, lmvm form ready to pick up at front desk

## 2024-11-25 NOTE — Telephone Encounter (Signed)
 sure
